# Patient Record
Sex: Male | Born: 1945 | Race: White | Hispanic: No | Marital: Married | Smoking: Never smoker
Health system: Southern US, Community
[De-identification: ages and names within clinical notes are randomized; demographics above are authoritative.]

## PROBLEM LIST (undated history)

## (undated) DIAGNOSIS — I10 Essential (primary) hypertension: Secondary | ICD-10-CM

## (undated) DIAGNOSIS — F419 Anxiety disorder, unspecified: Secondary | ICD-10-CM

## (undated) DIAGNOSIS — K219 Gastro-esophageal reflux disease without esophagitis: Secondary | ICD-10-CM

## (undated) DIAGNOSIS — E785 Hyperlipidemia, unspecified: Secondary | ICD-10-CM

## (undated) DIAGNOSIS — N400 Enlarged prostate without lower urinary tract symptoms: Secondary | ICD-10-CM

## (undated) DIAGNOSIS — I499 Cardiac arrhythmia, unspecified: Secondary | ICD-10-CM

## (undated) DIAGNOSIS — F329 Major depressive disorder, single episode, unspecified: Secondary | ICD-10-CM

## (undated) DIAGNOSIS — R0600 Dyspnea, unspecified: Secondary | ICD-10-CM

## (undated) DIAGNOSIS — K5792 Diverticulitis of intestine, part unspecified, without perforation or abscess without bleeding: Secondary | ICD-10-CM

## (undated) DIAGNOSIS — E119 Type 2 diabetes mellitus without complications: Secondary | ICD-10-CM

## (undated) DIAGNOSIS — F32A Depression, unspecified: Secondary | ICD-10-CM

## (undated) HISTORY — DX: Anxiety disorder, unspecified: F41.9

---

## 2004-10-02 ENCOUNTER — Ambulatory Visit: Payer: Self-pay | Admitting: General Surgery

## 2004-10-21 ENCOUNTER — Ambulatory Visit: Payer: Self-pay

## 2004-11-23 ENCOUNTER — Ambulatory Visit: Payer: Self-pay | Admitting: Pain Medicine

## 2004-12-07 ENCOUNTER — Ambulatory Visit: Payer: Self-pay | Admitting: Pain Medicine

## 2004-12-08 ENCOUNTER — Ambulatory Visit: Payer: Self-pay | Admitting: Pain Medicine

## 2004-12-24 ENCOUNTER — Ambulatory Visit: Payer: Self-pay | Admitting: Physician Assistant

## 2004-12-31 ENCOUNTER — Ambulatory Visit: Payer: Self-pay | Admitting: Pain Medicine

## 2005-01-15 ENCOUNTER — Ambulatory Visit: Payer: Self-pay | Admitting: Physician Assistant

## 2005-02-02 ENCOUNTER — Ambulatory Visit: Payer: Self-pay | Admitting: Pain Medicine

## 2005-03-01 ENCOUNTER — Ambulatory Visit: Payer: Self-pay | Admitting: Physician Assistant

## 2005-03-09 ENCOUNTER — Ambulatory Visit: Payer: Self-pay | Admitting: Pain Medicine

## 2005-03-23 ENCOUNTER — Ambulatory Visit: Payer: Self-pay | Admitting: Physician Assistant

## 2005-05-03 ENCOUNTER — Emergency Department: Payer: Self-pay | Admitting: General Practice

## 2005-05-07 ENCOUNTER — Ambulatory Visit: Payer: Self-pay | Admitting: Physician Assistant

## 2005-05-21 ENCOUNTER — Ambulatory Visit: Payer: Self-pay | Admitting: Physician Assistant

## 2005-05-25 ENCOUNTER — Ambulatory Visit: Payer: Self-pay | Admitting: Pain Medicine

## 2005-05-27 ENCOUNTER — Ambulatory Visit: Payer: Self-pay | Admitting: Pain Medicine

## 2005-06-16 ENCOUNTER — Ambulatory Visit: Payer: Self-pay | Admitting: Pain Medicine

## 2005-12-29 ENCOUNTER — Ambulatory Visit: Payer: Self-pay | Admitting: Physician Assistant

## 2006-04-08 ENCOUNTER — Ambulatory Visit: Payer: Self-pay | Admitting: Physician Assistant

## 2006-07-18 ENCOUNTER — Ambulatory Visit: Payer: Self-pay | Admitting: Physician Assistant

## 2006-08-08 ENCOUNTER — Ambulatory Visit: Payer: Self-pay | Admitting: Physician Assistant

## 2006-08-09 ENCOUNTER — Ambulatory Visit: Payer: Self-pay | Admitting: Pain Medicine

## 2006-09-06 ENCOUNTER — Ambulatory Visit: Payer: Self-pay | Admitting: Physician Assistant

## 2006-11-07 ENCOUNTER — Ambulatory Visit: Payer: Self-pay | Admitting: Physician Assistant

## 2007-01-17 ENCOUNTER — Ambulatory Visit: Payer: Self-pay | Admitting: Physician Assistant

## 2007-02-20 ENCOUNTER — Ambulatory Visit: Payer: Self-pay | Admitting: Physician Assistant

## 2007-04-24 ENCOUNTER — Ambulatory Visit: Payer: Self-pay | Admitting: Physician Assistant

## 2008-06-12 ENCOUNTER — Emergency Department: Payer: Self-pay | Admitting: Emergency Medicine

## 2008-09-17 ENCOUNTER — Ambulatory Visit: Payer: Self-pay | Admitting: General Surgery

## 2009-07-11 ENCOUNTER — Ambulatory Visit: Payer: Self-pay | Admitting: Internal Medicine

## 2010-07-22 ENCOUNTER — Ambulatory Visit: Payer: Self-pay | Admitting: Unknown Physician Specialty

## 2010-07-28 LAB — PATHOLOGY REPORT

## 2013-10-27 ENCOUNTER — Emergency Department: Payer: Self-pay | Admitting: Emergency Medicine

## 2013-10-27 LAB — CBC WITH DIFFERENTIAL/PLATELET
BASOS ABS: 0.1 10*3/uL (ref 0.0–0.1)
BASOS PCT: 0.8 %
Eosinophil #: 0.1 10*3/uL (ref 0.0–0.7)
Eosinophil %: 0.8 %
HCT: 44.5 % (ref 40.0–52.0)
HGB: 15.3 g/dL (ref 13.0–18.0)
Lymphocyte #: 1.8 10*3/uL (ref 1.0–3.6)
Lymphocyte %: 13.4 %
MCH: 28.8 pg (ref 26.0–34.0)
MCHC: 34.4 g/dL (ref 32.0–36.0)
MCV: 84 fL (ref 80–100)
MONO ABS: 0.9 x10 3/mm (ref 0.2–1.0)
Monocyte %: 6.8 %
Neutrophil #: 10.7 10*3/uL — ABNORMAL HIGH (ref 1.4–6.5)
Neutrophil %: 78.2 %
Platelet: 284 10*3/uL (ref 150–440)
RBC: 5.32 10*6/uL (ref 4.40–5.90)
RDW: 14.5 % (ref 11.5–14.5)
WBC: 13.7 10*3/uL — ABNORMAL HIGH (ref 3.8–10.6)

## 2013-10-27 LAB — BASIC METABOLIC PANEL
ANION GAP: 8 (ref 7–16)
BUN: 18 mg/dL (ref 7–18)
CALCIUM: 9.5 mg/dL (ref 8.5–10.1)
CHLORIDE: 100 mmol/L (ref 98–107)
CO2: 29 mmol/L (ref 21–32)
CREATININE: 1.04 mg/dL (ref 0.60–1.30)
EGFR (African American): >60
Glucose: 229 mg/dL — ABNORMAL HIGH (ref 65–99)
Osmolality: 283 (ref 275–301)
POTASSIUM: 3.5 mmol/L (ref 3.5–5.1)
Sodium: 137 mmol/L (ref 136–145)

## 2015-08-27 ENCOUNTER — Encounter: Payer: Self-pay | Admitting: *Deleted

## 2015-08-28 ENCOUNTER — Ambulatory Visit
Admission: RE | Admit: 2015-08-28 | Discharge: 2015-08-28 | Disposition: A | Discharge status: Home / Self Care | Payer: Medicare Other | Source: Ambulatory Visit | Attending: Unknown Physician Specialty | Admitting: Unknown Physician Specialty

## 2015-08-28 ENCOUNTER — Ambulatory Visit: Payer: Medicare Other | Admitting: Anesthesiology

## 2015-08-28 ENCOUNTER — Ambulatory Visit: Admit: 2015-08-28 | Payer: Self-pay | Admitting: Unknown Physician Specialty

## 2015-08-28 ENCOUNTER — Encounter
Admission: RE | Disposition: A | Discharge status: Home / Self Care | Payer: Self-pay | Source: Ambulatory Visit | Attending: Unknown Physician Specialty

## 2015-08-28 ENCOUNTER — Encounter: Payer: Self-pay | Admitting: Anesthesiology

## 2015-08-28 DIAGNOSIS — Z1211 Encounter for screening for malignant neoplasm of colon: Secondary | ICD-10-CM | POA: Insufficient documentation

## 2015-08-28 DIAGNOSIS — K219 Gastro-esophageal reflux disease without esophagitis: Secondary | ICD-10-CM | POA: Diagnosis not present

## 2015-08-28 DIAGNOSIS — E785 Hyperlipidemia, unspecified: Secondary | ICD-10-CM | POA: Insufficient documentation

## 2015-08-28 DIAGNOSIS — K640 First degree hemorrhoids: Secondary | ICD-10-CM | POA: Insufficient documentation

## 2015-08-28 DIAGNOSIS — D128 Benign neoplasm of rectum: Secondary | ICD-10-CM | POA: Diagnosis not present

## 2015-08-28 DIAGNOSIS — N400 Enlarged prostate without lower urinary tract symptoms: Secondary | ICD-10-CM | POA: Diagnosis not present

## 2015-08-28 DIAGNOSIS — E119 Type 2 diabetes mellitus without complications: Secondary | ICD-10-CM | POA: Diagnosis not present

## 2015-08-28 DIAGNOSIS — R1310 Dysphagia, unspecified: Secondary | ICD-10-CM | POA: Insufficient documentation

## 2015-08-28 DIAGNOSIS — K2970 Gastritis, unspecified, without bleeding: Secondary | ICD-10-CM | POA: Diagnosis not present

## 2015-08-28 DIAGNOSIS — F329 Major depressive disorder, single episode, unspecified: Secondary | ICD-10-CM | POA: Diagnosis not present

## 2015-08-28 HISTORY — DX: Type 2 diabetes mellitus without complications: E11.9

## 2015-08-28 HISTORY — DX: Hyperlipidemia, unspecified: E78.5

## 2015-08-28 HISTORY — DX: Depression, unspecified: F32.A

## 2015-08-28 HISTORY — PX: SAVORY DILATION: SHX5439

## 2015-08-28 HISTORY — DX: Gastro-esophageal reflux disease without esophagitis: K21.9

## 2015-08-28 HISTORY — PX: ESOPHAGOGASTRODUODENOSCOPY (EGD) WITH PROPOFOL: SHX5813

## 2015-08-28 HISTORY — DX: Major depressive disorder, single episode, unspecified: F32.9

## 2015-08-28 HISTORY — DX: Benign prostatic hyperplasia without lower urinary tract symptoms: N40.0

## 2015-08-28 HISTORY — PX: COLONOSCOPY WITH PROPOFOL: SHX5780

## 2015-08-28 LAB — GLUCOSE, CAPILLARY: Glucose-Capillary: 162 mg/dL — ABNORMAL HIGH (ref 65–99)

## 2015-08-28 SURGERY — ESOPHAGOGASTRODUODENOSCOPY (EGD) WITH PROPOFOL
Anesthesia: General

## 2015-08-28 MED ORDER — PHENYLEPHRINE HCL 10 MG/ML IJ SOLN
INTRAMUSCULAR | Status: DC | PRN
  Administered 2015-08-28: 50 ug via INTRAVENOUS

## 2015-08-28 MED ORDER — EPHEDRINE SULFATE 50 MG/ML IJ SOLN
INTRAMUSCULAR | Status: DC | PRN
  Administered 2015-08-28: 5 mg via INTRAVENOUS
  Administered 2015-08-28: 10 mg via INTRAVENOUS

## 2015-08-28 MED ORDER — MIDAZOLAM HCL 2 MG/2ML IJ SOLN
INTRAMUSCULAR | Status: DC | PRN
Start: 2015-08-28 — End: 2015-08-28
  Administered 2015-08-28: 1 mg via INTRAVENOUS

## 2015-08-28 MED ORDER — FENTANYL CITRATE (PF) 100 MCG/2ML IJ SOLN
INTRAMUSCULAR | Status: DC | PRN
  Administered 2015-08-28: 50 ug via INTRAVENOUS

## 2015-08-28 MED ORDER — SODIUM CHLORIDE 0.9 % IV SOLN
INTRAVENOUS | Status: DC
Start: 2015-08-28 — End: 2015-08-28
  Administered 2015-08-28: 1000 mL via INTRAVENOUS

## 2015-08-28 MED ORDER — SODIUM CHLORIDE 0.9 % IV SOLN: INTRAVENOUS | Status: DC

## 2015-08-28 MED ORDER — PROPOFOL 500 MG/50ML IV EMUL
INTRAVENOUS | Status: DC | PRN
  Administered 2015-08-28: 140 ug/kg/min via INTRAVENOUS

## 2015-08-28 NOTE — Op Note (Signed)
[facility] Gastroenterology Patient Name: Brent Alvarado Procedure Date: 08/28/2015 8:09 AM MRN: [id number] Account #: [id number] Date of Birth: Mar 31, 1946 Admit Type: Outpatient Age: 69 Room: [facility] ENDO ROOM 1 Gender: Male Note Status: Finalized Procedure:         Colonoscopy Indications:       Screening for colorectal malignant neoplasm Providers:         Manya Silvas, MD Medicines:         Propofol per Anesthesia Complications:     No immediate complications. Procedure:         Pre-Anesthesia Assessment:                    - After reviewing the risks and benefits, the patient was                     deemed in satisfactory condition to undergo the procedure.                    After obtaining informed consent, the colonoscope was                     passed under direct vision. Throughout the procedure, the                     patient's blood pressure, pulse, and oxygen saturations                     were monitored continuously. The Colonoscope was                     introduced through the anus and advanced to the the cecum,                     identified by appendiceal orifice and ileocecal valve. The                     colonoscopy was performed without difficulty. The patient                     tolerated the procedure well. The quality of the bowel                     preparation was excellent. Findings:      A diminutive polyp was found in the rectum. The polyp was sessile. The       polyp was removed with a jumbo cold forceps. Resection and retrieval       were complete.      Internal hemorrhoids were found during endoscopy. The hemorrhoids were       small and Grade I (internal hemorrhoids that do not prolapse).      The exam was otherwise without abnormality. Impression:        - One diminutive polyp in the rectum. Resected and                     retrieved.                    - Internal hemorrhoids.                    - The examination was  otherwise normal. Recommendation:    - Await pathology results. Manya Silvas, MD 08/28/2015 8:48:03 AM This report has been signed electronically. Number of Addenda:  0 Note Initiated On: 08/28/2015 8:09 AM Scope Withdrawal Time: 0 hours 10 minutes 48 seconds  Total Procedure Duration: 0 hours 15 minutes 59 seconds       [facility]

## 2015-08-28 NOTE — H&P (Signed)
   Primary Care Physician:  Rusty Aus., MD Primary Gastroenterologist:  Dr. Vira Agar  Pre-Procedure History & Physical: HPI:  Brent Alvarado is a 69 y.o. male is here for an endoscopy and colonoscopy.   Past Medical History  Diagnosis Date  . GERD (gastroesophageal reflux disease)   . Diabetes mellitus without complication ([place])   . Depression   . Hyperlipidemia   . BPH (benign prostatic hyperplasia)     History reviewed. No pertinent past surgical history.  Prior to Admission medications   Medication Sig Start Date End Date Taking? Authorizing Provider  baclofen (LIORESAL) 10 MG tablet Take 10 mg by mouth 3 (three) times daily.   Yes Historical Provider, MD  furosemide (LASIX) 20 MG tablet Take 20 mg by mouth.   Yes Historical Provider, MD  metFORMIN (GLUCOPHAGE) 500 MG tablet Take by mouth 2 (two) times daily with a meal.   Yes Historical Provider, MD  omeprazole (PRILOSEC) 40 MG capsule Take 40 mg by mouth daily.   Yes Historical Provider, MD  oxyCODONE-acetaminophen (PERCOCET/ROXICET) 5-325 MG tablet Take by mouth every 4 (four) hours as needed for severe pain.   Yes Historical Provider, MD  PARoxetine (PAXIL) 40 MG tablet Take 40 mg by mouth every morning.   Yes Historical Provider, MD  polyethylene glycol (MIRALAX / GLYCOLAX) packet Take 17 g by mouth daily.   Yes Historical Provider, MD  pravastatin (PRAVACHOL) 40 MG tablet Take 40 mg by mouth daily.   Yes Historical Provider, MD  tamsulosin (FLOMAX) 0.4 MG CAPS capsule Take 0.4 mg by mouth.   Yes Historical Provider, MD  traMADol (ULTRAM) 50 MG tablet Take by mouth every 6 (six) hours as needed.   Yes Historical Provider, MD    Allergies as of 06/11/2015  . (Not on File)    History reviewed. No pertinent family history.  Social History   Social History  . Marital Status: Married    Spouse Name: N/A  . Number of Children: N/A  . Years of Education: N/A   Occupational History  . Not on file.   Social History  Main Topics  . Smoking status: Not on file  . Smokeless tobacco: Not on file  . Alcohol Use: Not on file  . Drug Use: Not on file  . Sexual Activity: Not on file   Other Topics Concern  . Not on file   Social History Narrative    Review of Systems: See HPI, otherwise negative ROS  Physical Exam: BP 183/89 mmHg  Pulse 71  Temp(Src) 97.1 F (36.2 C) (Tympanic)  Resp 16  Ht 6\' 1"  (1.854 m)  Wt 95.255 kg (210 lb)  BMI 27.71 kg/m2  SpO2 99% General:   Alert,  pleasant and cooperative in NAD Head:  Normocephalic and atraumatic. Neck:  Supple; no masses or thyromegaly. Lungs:  Clear throughout to auscultation.    Heart:  Regular rate and rhythm. Abdomen:  Soft, nontender and nondistended. Normal bowel sounds, without guarding, and without rebound.   Neurologic:  Alert and  oriented x4;  grossly normal neurologically.  Impression/Plan: Brent Alvarado is here for an endoscopy and colonoscopy to be performed for dysphagia and colon cancer screening.  Risks, benefits, limitations, and alternatives regarding  endoscopy and colonoscopy have been reviewed with the patient.  Questions have been answered.  All parties agreeable.   Gaylyn Cheers, MD  08/28/2015, 8:10 AM

## 2015-08-28 NOTE — Anesthesia Procedure Notes (Signed)
Performed by: COOK-MARTIN, Lorilynn Lehr Pre-anesthesia Checklist: Patient identified, Emergency Drugs available, Suction available, Patient being monitored and Timeout performed Patient Re-evaluated:Patient Re-evaluated prior to inductionOxygen Delivery Method: Nasal cannula Preoxygenation: Pre-oxygenation with 100% oxygen Intubation Type: IV induction Placement Confirmation: positive ETCO2 and CO2 detector       

## 2015-08-28 NOTE — Transfer of Care (Signed)
Immediate Anesthesia Transfer of Care Note  Patient: Tim P Rozier  Procedure(s) Performed: Procedure(s): ESOPHAGOGASTRODUODENOSCOPY (EGD) WITH PROPOFOL (N/A) SAVORY DILATION (N/A) COLONOSCOPY WITH PROPOFOL (N/A)  Patient Location: PACU  Anesthesia Type:General  Level of Consciousness: awake and sedated  Airway & Oxygen Therapy: Patient Spontanous Breathing and Patient connected to nasal cannula oxygen  Post-op Assessment: Report given to RN and Post -op Vital signs reviewed and stable  Post vital signs: Reviewed and stable  Last Vitals:  Filed Vitals:   08/28/15 0718  BP: 183/89  Pulse: 71  Temp: 36.2 C  Resp: 16    Complications: No apparent anesthesia complications

## 2015-08-28 NOTE — Anesthesia Postprocedure Evaluation (Signed)
  Anesthesia Post-op Note  Patient: Brent Alvarado  Procedure(s) Performed: Procedure(s): ESOPHAGOGASTRODUODENOSCOPY (EGD) WITH PROPOFOL (N/A) SAVORY DILATION (N/A) COLONOSCOPY WITH PROPOFOL (N/A)  Anesthesia type:General  Patient location: PACU  Post pain: Pain level controlled  Post assessment: Post-op Vital signs reviewed, Patient's Cardiovascular Status Stable, Respiratory Function Stable, Patent Airway and No signs of Nausea or vomiting  Post vital signs: Reviewed and stable  Last Vitals:  Filed Vitals:   08/28/15 0922  BP: 152/77  Pulse: 69  Temp:   Resp: 14    Level of consciousness: awake, alert  and patient cooperative  Complications: No apparent anesthesia complications

## 2015-08-28 NOTE — Anesthesia Preprocedure Evaluation (Addendum)
Anesthesia Evaluation  Patient identified by MRN, date of birth, ID band Patient awake    Reviewed: Allergy & Precautions, NPO status   Airway Mallampati: III  TM Distance: >3 FB Neck ROM: Full    Dental  (+) Chipped   Pulmonary neg pulmonary ROS,    Pulmonary exam normal        Cardiovascular negative cardio ROS Normal cardiovascular exam     Neuro/Psych Depression    GI/Hepatic GERD  Medicated and Controlled,  Endo/Other  diabetes, Well Controlled, Type 2  Renal/GU   negative genitourinary   Musculoskeletal negative musculoskeletal ROS (+)   Abdominal Normal abdominal exam  (+)   Peds negative pediatric ROS (+)  Hematology negative hematology ROS (+)   Anesthesia Other Findings   Reproductive/Obstetrics                            Anesthesia Physical Anesthesia Plan  ASA: II  Anesthesia Plan: General   Post-op Pain Management:    Induction: Intravenous  Airway Management Planned: Nasal Cannula  Additional Equipment:   Intra-op Plan:   Post-operative Plan:   Informed Consent: I have reviewed the patients History and Physical, chart, labs and discussed the procedure including the risks, benefits and alternatives for the proposed anesthesia with the patient or authorized representative who has indicated his/her understanding and acceptance.   Dental advisory given  Plan Discussed with: CRNA and Surgeon  Anesthesia Plan Comments:         Anesthesia Quick Evaluation

## 2015-08-28 NOTE — Op Note (Signed)
[facility] Gastroenterology Patient Name: Brent Alvarado Procedure Date: 08/28/2015 8:08 AM MRN: [id number] Account #: [id number] Date of Birth: 1946-07-07 Admit Type: Outpatient Age: 69 Room: [facility] ENDO ROOM 1 Gender: Male Note Status: Finalized Procedure:         Upper GI endoscopy Indications:       Dysphagia Providers:         Manya Silvas, MD Referring MD:      Rusty Aus, MD (Referring MD) Medicines:         Propofol per Anesthesia Complications:     No immediate complications. Procedure:         Pre-Anesthesia Assessment:                    - After reviewing the risks and benefits, the patient was                     deemed in satisfactory condition to undergo the procedure.                    After obtaining informed consent, the endoscope was passed                     under direct vision. Throughout the procedure, the                     patient's blood pressure, pulse, and oxygen saturations                     were monitored continuously. The Endoscope was introduced                     through the mouth, and advanced to the second part of                     duodenum. The upper GI endoscopy was accomplished without                     difficulty. The patient tolerated the procedure well. Findings:      The examined esophagus was normal. At the end of the procedure A       guidewire was placed and the scope was withdrawn. Dilation was performed       with a Savary dilator with mild resistance at 17 mm.      Localized mild inflammation characterized by erythema and granularity       was found in the gastric antrum. Biopsies were taken with a cold forceps       for histology. Biopsies were taken with a cold forceps for Helicobacter       pylori testing.      The examined duodenum was normal. Impression:        - Normal esophagus. Dilated.                    - Gastritis. Biopsied.                    - Normal examined duodenum. Recommendation:     - Await pathology results. Do colonoscopy Manya Silvas, MD 08/28/2015 8:25:45 AM This report has been signed electronically. Number of Addenda: 0 Note Initiated On: 08/28/2015 8:08 AM      [facility]

## 2015-08-29 LAB — SURGICAL PATHOLOGY

## 2015-09-01 ENCOUNTER — Encounter: Payer: Self-pay | Admitting: Unknown Physician Specialty

## 2016-11-09 ENCOUNTER — Other Ambulatory Visit: Payer: Self-pay | Admitting: Nurse Practitioner

## 2016-11-09 DIAGNOSIS — R140 Abdominal distension (gaseous): Secondary | ICD-10-CM

## 2016-11-15 ENCOUNTER — Ambulatory Visit
Admission: RE | Admit: 2016-11-15 | Discharge: 2016-11-15 | Disposition: A | Discharge status: Home / Self Care | Payer: Medicare Other | Source: Ambulatory Visit | Attending: Nurse Practitioner | Admitting: Nurse Practitioner

## 2016-11-15 DIAGNOSIS — R140 Abdominal distension (gaseous): Secondary | ICD-10-CM | POA: Diagnosis present

## 2016-11-15 MED ORDER — TECHNETIUM TC 99M SULFUR COLLOID
2.0000 | Freq: Once | INTRAVENOUS | Status: AC | PRN
  Administered 2016-11-15: 2.718 via INTRAVENOUS

## 2017-05-02 ENCOUNTER — Encounter: Payer: Self-pay | Admitting: Emergency Medicine

## 2017-05-02 ENCOUNTER — Emergency Department: Payer: Medicare Other

## 2017-05-02 ENCOUNTER — Emergency Department
Admission: EM | Admit: 2017-05-02 | Discharge: 2017-05-02 | Disposition: A | Discharge status: Home / Self Care | Payer: Medicare Other | Attending: Emergency Medicine | Admitting: Emergency Medicine

## 2017-05-02 DIAGNOSIS — E119 Type 2 diabetes mellitus without complications: Secondary | ICD-10-CM | POA: Diagnosis not present

## 2017-05-02 DIAGNOSIS — Z7902 Long term (current) use of antithrombotics/antiplatelets: Secondary | ICD-10-CM | POA: Insufficient documentation

## 2017-05-02 DIAGNOSIS — R42 Dizziness and giddiness: Secondary | ICD-10-CM | POA: Diagnosis present

## 2017-05-02 DIAGNOSIS — Z79899 Other long term (current) drug therapy: Secondary | ICD-10-CM | POA: Insufficient documentation

## 2017-05-02 DIAGNOSIS — R531 Weakness: Secondary | ICD-10-CM | POA: Diagnosis not present

## 2017-05-02 DIAGNOSIS — Z7984 Long term (current) use of oral hypoglycemic drugs: Secondary | ICD-10-CM | POA: Insufficient documentation

## 2017-05-02 LAB — CBC
HCT: 38.5 % — ABNORMAL LOW (ref 40.0–52.0)
Hemoglobin: 13.4 g/dL (ref 13.0–18.0)
MCH: 30.1 pg (ref 26.0–34.0)
MCHC: 34.9 g/dL (ref 32.0–36.0)
MCV: 86.3 fL (ref 80.0–100.0)
PLATELETS: 284 10*3/uL (ref 150–440)
RBC: 4.46 MIL/uL (ref 4.40–5.90)
RDW: 14.4 % (ref 11.5–14.5)
WBC: 8.4 10*3/uL (ref 3.8–10.6)

## 2017-05-02 LAB — BASIC METABOLIC PANEL
Anion gap: 10 (ref 5–15)
BUN: 29 mg/dL — AB (ref 6–20)
CALCIUM: 9.2 mg/dL (ref 8.9–10.3)
CO2: 27 mmol/L (ref 22–32)
CREATININE: 1.24 mg/dL (ref 0.61–1.24)
Chloride: 102 mmol/L (ref 101–111)
GFR calc Af Amer: >60 mL/min (ref >60)
GFR, EST NON AFRICAN AMERICAN: 56 mL/min — AB (ref >60)
Glucose, Bld: 159 mg/dL — ABNORMAL HIGH (ref 65–99)
Potassium: 4.3 mmol/L (ref 3.5–5.1)
Sodium: 139 mmol/L (ref 135–145)

## 2017-05-02 LAB — TROPONIN I: Troponin I: <0.03 ng/mL (ref <0.03)

## 2017-05-02 MED ORDER — DIAZEPAM 5 MG PO TABS
5.0000 mg | ORAL_TABLET | Freq: Once | ORAL | Status: AC
  Administered 2017-05-02: 5 mg via ORAL
  Filled 2017-05-02: qty 1

## 2017-05-02 MED ORDER — SODIUM CHLORIDE 0.9 % IV BOLUS (SEPSIS)
1000.0000 mL | Freq: Once | INTRAVENOUS | Status: AC
  Administered 2017-05-02: 1000 mL via INTRAVENOUS

## 2017-05-02 NOTE — ED Notes (Signed)
FN: pt to ed via private vehicle with c/o being dizzy for two months with several falls per pt. Pt ambulatory to stat desk with walker, no distress noted. Pt PCP is Dr. Sabra Heck.

## 2017-05-02 NOTE — ED Notes (Signed)
Pt taken to MRI by [facility] ED Medic

## 2017-05-02 NOTE — ED Notes (Signed)
Pt returned from MRI °

## 2017-05-02 NOTE — ED Notes (Signed)
Pt states in January he got sick with vomiting. States that has cleared up but basically since then has been getting dizzy and falling often. States has seen Dr. Tami Ribas and been told it's not vertigo. States when he stands and moves around that he becomes dizzy. States he usually falls to left side. Mentions multiple falls. Wife states that she will grab his pants to keep him from falling to floor. Pt also states he usually catches himself before falling to floor. States 4-6 weeks ago fractured L ribs from fall. States he wants to see cardiologist. Not able to get in until end of august. States has neurology appt July 31st. States "I only eat because I have to."  Alert, oriented.

## 2017-05-02 NOTE — ED Provider Notes (Signed)
[facility] Emergency Department Provider Note  Time seen: 2:08 PM  I have reviewed the triage vital signs and the nursing notes.   HISTORY  Chief Complaint Dizziness and Fall    HPI Brent Alvarado is a 71 y.o. male with a past medical history of diabetes, gastric reflux, hyperlipidemia, presents to the emergency department for symptoms of weakness. Patient states over the past several months he has been experiencing intermittent episodes of weakness which she describes as a feeling of flushing across his body with left-sided weakness. He states he often will fall to his left side. Patient states the symptoms occur most often when walking or first upon standing. He states over the past several weeks in addition to feeling weak on the left side his left side goes numb and he is unable to feel or use his left arm or leg. Denies any headache. Denies any confusion or slurred speech. Patient has been seeing his doctor regarding these episodes who has referred him to a cardiologist as well as a neurologist. States he is not scheduled to see them until mid to late August. States his symptoms are worsening so he came to the emergency department for evaluation. Denies any acute worsening, but states they're progressively getting worse and he did not feel he could wait until August for an evaluation.  Past Medical History:  Diagnosis Date  . BPH (benign prostatic hyperplasia)   . Depression   . Diabetes mellitus without complication ([place])    Controlled poorly with metformin  . GERD (gastroesophageal reflux disease)   . Hyperlipidemia     There are no active problems to display for this patient.   Past Surgical History:  Procedure Laterality Date  . COLONOSCOPY WITH PROPOFOL N/A 08/28/2015   Procedure: COLONOSCOPY WITH PROPOFOL;  Surgeon: Manya Silvas, MD;  Location: [facility] ENDOSCOPY;  Service: Endoscopy;  Laterality: N/A;  . ESOPHAGOGASTRODUODENOSCOPY (EGD) WITH PROPOFOL  N/A 08/28/2015   Procedure: ESOPHAGOGASTRODUODENOSCOPY (EGD) WITH PROPOFOL;  Surgeon: Manya Silvas, MD;  Location: [facility] ENDOSCOPY;  Service: Endoscopy;  Laterality: N/A;  . SAVORY DILATION N/A 08/28/2015   Procedure: SAVORY DILATION;  Surgeon: Manya Silvas, MD;  Location: [facility] ENDOSCOPY;  Service: Endoscopy;  Laterality: N/A;    Prior to Admission medications   Medication Sig Start Date End Date Taking? Authorizing Provider  baclofen (LIORESAL) 10 MG tablet Take 10 mg by mouth 3 (three) times daily.   Yes [provider]  carvedilol (COREG) 12.5 MG tablet Take by mouth 2 (two) times daily with a meal.  01/27/17  Yes [provider]  docusate sodium (COLACE) 100 MG capsule Take 100 mg by mouth daily as needed.  11/30/16  Yes [provider]  furosemide (LASIX) 20 MG tablet Take 20 mg by mouth daily as needed.    Yes [provider]  losartan (COZAAR) 50 MG tablet Take by mouth daily.    Yes [provider]  metFORMIN (GLUCOPHAGE) 1000 MG tablet Take 1,000 mg by mouth 2 (two) times daily with a meal.    Yes [provider]  PARoxetine (PAXIL) 40 MG tablet Take 40 mg by mouth every morning.   Yes [provider]  pravastatin (PRAVACHOL) 40 MG tablet Take 40 mg by mouth daily.   Yes [provider]  tamsulosin (FLOMAX) 0.4 MG CAPS capsule Take 0.4 mg by mouth.   Yes [provider]  oxyCODONE-acetaminophen (PERCOCET/ROXICET) 5-325 MG tablet Take by mouth every 4 (four) hours as  needed for severe pain.    [provider]  SUMAtriptan (IMITREX) 100 MG tablet Take by mouth.    [provider]  traMADol (ULTRAM) 50 MG tablet Take by mouth every 6 (six) hours as needed.    [provider]    Allergies  Allergen Reactions  . Ace Inhibitors Swelling    No family history on file.  Social History Social History  Substance Use Topics  . Smoking status: Never Smoker  . Smokeless  tobacco: Never Used  . Alcohol use No    Review of Systems Constitutional: Negative for fever. Cardiovascular: Negative for chest pain. Respiratory: Negative for shortness of breath. Gastrointestinal: Negative for abdominal pain, vomiting and diarrhea. Genitourinary: Negative for dysuria. Musculoskeletal: Negative for back pain.Negative for neck pain. Skin: Negative for rash. Neurological: Negative for headache . Intermittent episodes of left arm and left leg weakness and numbness All other ROS negative  ____________________________________________   PHYSICAL EXAM:  VITAL SIGNS: ED Triage Vitals  Enc Vitals Group     BP 05/02/17 0957 121/72     Pulse Rate 05/02/17 0957 100     Resp 05/02/17 0957 16     Temp 05/02/17 0957 98.1 F (36.7 C)     Temp Source 05/02/17 0957 Oral     SpO2 05/02/17 0957 98 %     Weight 05/02/17 0943 210 lb (95.3 kg)     Height 05/02/17 0958 6' (1.829 m)     Head Circumference --      Peak Flow --      Pain Score --      Pain Loc --      Pain Edu? --      Excl. in [place]? --     Constitutional: Alert and oriented. Well appearing and in no distress. Eyes: Normal exam ENT   Head: Normocephalic and atraumatic.   Mouth/Throat: Mucous membranes are moist. Cardiovascular: Normal rate, regular rhythm. Respiratory: Normal respiratory effort without tachypnea nor retractions. Breath sounds are clear  Gastrointestinal: Soft and nontender. No distention.  Musculoskeletal: Nontender with normal range of motion in all extremities.  Neurologic:  Normal speech and language. No gross focal neurologic deficits Skin:  Skin is warm, dry and intact.  Psychiatric: Mood and affect are normal.  ____________________________________________    EKG  EKG reviewed and interpreted by myself shows normal sinus rhythm at 57 bpm, narrow QRS, normal axis, normal intervals, no concerning ST changes.  ____________________________________________     RADIOLOGY  Chest x-ray shows fracture of the left ninth rib which the patient states occurred last month after a fall.  ____________________________________________   INITIAL IMPRESSION / ASSESSMENT AND PLAN / ED COURSE  Pertinent labs & imaging results that were available during my care of the patient were reviewed by me and considered in my medical decision making (see chart for details).  Patient presents to the emergency department for intermittent episodes of left arm and leg weakness and numbness. He states usually when he is walking her first upon standing he will feel very lightheaded with a feeling of flushing going across his body followed by left arm weakness and numbness which typically lasts several minutes. Patient states he's had multiple falls to his left side due to the weakness. States it is always his left side because weak and numb. States the numbness started approximately 2 weeks ago. Patient denies any acute worsening of symptoms however states he did not feel he could wait until August for an evaluation so  he came to the emergency department. Here the patient looks well, no complaints at this time denies any weakness or numbness currently. Patient's neurological exam is normal. However given the patient's symptoms of left-sided symptoms we will obtain an MRI to further evaluate. Patient has appointment with neurology and cardiology but not until next month. Patient's labs are normal including negative troponin.  MRI is negative. Patient states he is feeling better he is requesting to be discharged home. We'll discharge the patient home with neurology and cardiology follow-up as scheduled.    ____________________________________________   FINAL CLINICAL IMPRESSION(S) / ED DIAGNOSES  Weakness    Harvest Dark, MD 05/02/17 [phone]

## 2017-05-02 NOTE — ED Triage Notes (Signed)
Pt with dizziness and falls for two mths.

## 2017-05-02 NOTE — ED Notes (Signed)
Pt in xray

## 2018-02-25 ENCOUNTER — Other Ambulatory Visit: Payer: Self-pay

## 2018-02-25 ENCOUNTER — Emergency Department
Admission: EM | Admit: 2018-02-25 | Discharge: 2018-02-25 | Disposition: A | Discharge status: Home / Self Care | Payer: Medicare Other | Attending: Emergency Medicine | Admitting: Emergency Medicine

## 2018-02-25 ENCOUNTER — Encounter: Payer: Self-pay | Admitting: Emergency Medicine

## 2018-02-25 ENCOUNTER — Emergency Department: Payer: Medicare Other

## 2018-02-25 DIAGNOSIS — Z7984 Long term (current) use of oral hypoglycemic drugs: Secondary | ICD-10-CM | POA: Diagnosis not present

## 2018-02-25 DIAGNOSIS — R112 Nausea with vomiting, unspecified: Secondary | ICD-10-CM

## 2018-02-25 DIAGNOSIS — Z79899 Other long term (current) drug therapy: Secondary | ICD-10-CM | POA: Diagnosis not present

## 2018-02-25 DIAGNOSIS — R1013 Epigastric pain: Secondary | ICD-10-CM | POA: Insufficient documentation

## 2018-02-25 DIAGNOSIS — I491 Atrial premature depolarization: Secondary | ICD-10-CM | POA: Diagnosis not present

## 2018-02-25 DIAGNOSIS — E119 Type 2 diabetes mellitus without complications: Secondary | ICD-10-CM | POA: Insufficient documentation

## 2018-02-25 DIAGNOSIS — R0602 Shortness of breath: Secondary | ICD-10-CM

## 2018-02-25 DIAGNOSIS — R42 Dizziness and giddiness: Secondary | ICD-10-CM

## 2018-02-25 LAB — COMPREHENSIVE METABOLIC PANEL
ALBUMIN: 4.6 g/dL (ref 3.5–5.0)
ALK PHOS: 66 U/L (ref 38–126)
ALT: 16 U/L — ABNORMAL LOW (ref 17–63)
AST: 22 U/L (ref 15–41)
Anion gap: 15 (ref 5–15)
BILIRUBIN TOTAL: 0.6 mg/dL (ref 0.3–1.2)
BUN: 18 mg/dL (ref 6–20)
CALCIUM: 10 mg/dL (ref 8.9–10.3)
CO2: 22 mmol/L (ref 22–32)
Chloride: 101 mmol/L (ref 101–111)
Creatinine, Ser: 1.41 mg/dL — ABNORMAL HIGH (ref 0.61–1.24)
GFR calc Af Amer: 56 mL/min — ABNORMAL LOW (ref >60)
GFR calc non Af Amer: 48 mL/min — ABNORMAL LOW (ref >60)
GLUCOSE: 265 mg/dL — AB (ref 65–99)
POTASSIUM: 3 mmol/L — AB (ref 3.5–5.1)
SODIUM: 138 mmol/L (ref 135–145)
TOTAL PROTEIN: 8.1 g/dL (ref 6.5–8.1)

## 2018-02-25 LAB — CBC
HEMATOCRIT: 43.1 % (ref 40.0–52.0)
HEMOGLOBIN: 15.2 g/dL (ref 13.0–18.0)
MCH: 31 pg (ref 26.0–34.0)
MCHC: 35.3 g/dL (ref 32.0–36.0)
MCV: 87.7 fL (ref 80.0–100.0)
Platelets: 406 10*3/uL (ref 150–440)
RBC: 4.92 MIL/uL (ref 4.40–5.90)
RDW: 14.3 % (ref 11.5–14.5)
WBC: 15.7 10*3/uL — ABNORMAL HIGH (ref 3.8–10.6)

## 2018-02-25 LAB — URINALYSIS, COMPLETE (UACMP) WITH MICROSCOPIC
BACTERIA UA: NONE SEEN
Bilirubin Urine: NEGATIVE
Glucose, UA: NEGATIVE mg/dL
HGB URINE DIPSTICK: NEGATIVE
Ketones, ur: 5 mg/dL — AB
Leukocytes, UA: NEGATIVE
NITRITE: NEGATIVE
PROTEIN: NEGATIVE mg/dL
SPECIFIC GRAVITY, URINE: 1.012 (ref 1.005–1.030)
Squamous Epithelial / LPF: NONE SEEN (ref 0–5)
pH: 6 (ref 5.0–8.0)

## 2018-02-25 LAB — TROPONIN I: TROPONIN I: 0.04 ng/mL — AB (ref <0.03)

## 2018-02-25 LAB — LIPASE, BLOOD: Lipase: 31 U/L (ref 11–51)

## 2018-02-25 MED ORDER — SODIUM CHLORIDE 0.9 % IV BOLUS
1000.0000 mL | Freq: Once | INTRAVENOUS | Status: AC
  Administered 2018-02-25: 1000 mL via INTRAVENOUS

## 2018-02-25 MED ORDER — METOPROLOL TARTRATE 5 MG/5ML IV SOLN
5.0000 mg | Freq: Once | INTRAVENOUS | Status: AC
  Administered 2018-02-25: 5 mg via INTRAVENOUS

## 2018-02-25 MED ORDER — POTASSIUM CHLORIDE CRYS ER 20 MEQ PO TBCR
40.0000 meq | EXTENDED_RELEASE_TABLET | Freq: Once | ORAL | Status: AC
  Administered 2018-02-25: 40 meq via ORAL
  Filled 2018-02-25: qty 2

## 2018-02-25 MED ORDER — MECLIZINE HCL 25 MG PO TABS
25.0000 mg | ORAL_TABLET | Freq: Once | ORAL | Status: AC
  Administered 2018-02-25: 25 mg via ORAL
  Filled 2018-02-25: qty 1

## 2018-02-25 MED ORDER — METOPROLOL TARTRATE 5 MG/5ML IV SOLN
INTRAVENOUS | Status: AC
  Filled 2018-02-25: qty 5

## 2018-02-25 MED ORDER — ONDANSETRON 4 MG PO TBDP
4.0000 mg | ORAL_TABLET | Freq: Once | ORAL | Status: AC | PRN
  Administered 2018-02-25: 4 mg via ORAL
  Filled 2018-02-25: qty 1

## 2018-02-25 NOTE — ED Notes (Signed)
Pt ambulated in hallway with assistance by this RN, once completed pt states he feels "dizzy, wobbly and short of breath." Pt's O2 98% on RA. EDP notified.

## 2018-02-25 NOTE — ED Notes (Signed)
Patient transported to X-ray 

## 2018-02-25 NOTE — Discharge Instructions (Addendum)
Please seek medical attention for any high fevers, chest pain, shortness of breath, change in behavior, persistent vomiting, bloody stool or any other new or concerning symptoms.  

## 2018-02-25 NOTE — ED Notes (Signed)
Pt given cherry popsicle

## 2018-02-25 NOTE — ED Triage Notes (Signed)
Vomiting, onset this am.  C/O headache. Denies abdominal pain.

## 2018-02-25 NOTE — ED Notes (Signed)

## 2018-02-25 NOTE — ED Notes (Signed)
ED Provider at bedside. 

## 2018-02-25 NOTE — ED Provider Notes (Signed)
[facility] Emergency Department Provider Note   ____________________________________________   I have reviewed the triage vital signs and the nursing notes.   HISTORY  Chief Complaint Shortness of breath  History limited by: Not Limited   HPI Brent Alvarado is a 72 y.o. male who presents to the emergency department with chief complaint of shortness of breath. He states that he has had this for years. Follows with Dr. Humphrey Rolls. It has been worse the past couple of weeks and he is scheduled for a stress test in two days. It appears however that the reason he came today was because of vomiting. Had multiple episodes of vomiting that started today. It was accompanied by some epigastric abdominal discomfort. He is also complaining of dizziness but this too has been present for quite some time.    Per medical record review patient has a history of DM  Past Medical History:  Diagnosis Date  . BPH (benign prostatic hyperplasia)   . Depression   . Diabetes mellitus without complication ([place])    Controlled poorly with metformin  . GERD (gastroesophageal reflux disease)   . Hyperlipidemia     There are no active problems to display for this patient.   Past Surgical History:  Procedure Laterality Date  . COLONOSCOPY WITH PROPOFOL N/A 08/28/2015   Procedure: COLONOSCOPY WITH PROPOFOL;  Surgeon: Manya Silvas, MD;  Location: [facility] ENDOSCOPY;  Service: Endoscopy;  Laterality: N/A;  . ESOPHAGOGASTRODUODENOSCOPY (EGD) WITH PROPOFOL N/A 08/28/2015   Procedure: ESOPHAGOGASTRODUODENOSCOPY (EGD) WITH PROPOFOL;  Surgeon: Manya Silvas, MD;  Location: [facility] ENDOSCOPY;  Service: Endoscopy;  Laterality: N/A;  . SAVORY DILATION N/A 08/28/2015   Procedure: SAVORY DILATION;  Surgeon: Manya Silvas, MD;  Location: [facility] ENDOSCOPY;  Service: Endoscopy;  Laterality: N/A;    Prior to Admission medications   Medication Sig Start Date End Date Taking? Authorizing Provider   baclofen (LIORESAL) 10 MG tablet Take 10 mg by mouth 3 (three) times daily.    [provider]  carvedilol (COREG) 12.5 MG tablet Take by mouth 2 (two) times daily with a meal.  01/27/17   [provider]  docusate sodium (COLACE) 100 MG capsule Take 100 mg by mouth daily as needed.  11/30/16   [provider]  furosemide (LASIX) 20 MG tablet Take 20 mg by mouth daily as needed.     [provider]  losartan (COZAAR) 50 MG tablet Take by mouth daily.     [provider]  metFORMIN (GLUCOPHAGE) 1000 MG tablet Take 1,000 mg by mouth 2 (two) times daily with a meal.     [provider]  oxyCODONE-acetaminophen (PERCOCET/ROXICET) 5-325 MG tablet Take by mouth every 4 (four) hours as needed for severe pain.    [provider]  PARoxetine (PAXIL) 40 MG tablet Take 40 mg by mouth every morning.    [provider]  pravastatin (PRAVACHOL) 40 MG tablet Take 40 mg by mouth daily.    [provider]  SUMAtriptan (IMITREX) 100 MG tablet Take by mouth.    [provider]  tamsulosin (FLOMAX) 0.4 MG CAPS capsule Take 0.4 mg by mouth.    [provider]  traMADol (ULTRAM) 50 MG tablet Take by mouth every 6 (six) hours as needed.    [provider]    Allergies Ace inhibitors  No family history on file.  Social History Social History   Tobacco Use  . Smoking status: Never Smoker  . Smokeless tobacco:  Never Used  Substance Use Topics  . Alcohol use: No  . Drug use: No    Review of Systems Constitutional: No fever/chills Eyes: No visual changes. ENT: No sore throat. Cardiovascular: Denies chest pain. Respiratory: Positive for shortness of breath. Gastrointestinal: Positive for nausea, vomiting. Positive for epigastric.  Genitourinary: Negative for dysuria. Musculoskeletal: Negative for back pain. Skin: Negative for rash. Neurological: Negative for headaches, focal weakness or  numbness.  ____________________________________________   PHYSICAL EXAM:  VITAL SIGNS: ED Triage Vitals  Enc Vitals Group     BP 02/25/18 1543 (!) 125/91     Pulse Rate 02/25/18 1543 67     Resp 02/25/18 1543 16     Temp 02/25/18 1543 97.6 F (36.4 C)     Temp Source 02/25/18 1543 Oral     SpO2 02/25/18 1543 98 %     Weight 02/25/18 1544 210 lb (95.3 kg)     Height 02/25/18 1544 6\' 1"  (1.854 m)     Head Circumference --      Peak Flow --      Pain Score 02/25/18 1544 0   Constitutional: Alert and oriented. Well appearing and in no distress. Eyes: Conjunctivae are normal.  ENT   Head: Normocephalic and atraumatic.   Nose: No congestion/rhinnorhea.   Mouth/Throat: Mucous membranes are moist.   Neck: No stridor. Hematological/Lymphatic/Immunilogical: No cervical lymphadenopathy. Cardiovascular: Normal rate, regular rhythm.  No murmurs, rubs, or gallops.  Respiratory: Normal respiratory effort without tachypnea nor retractions. Breath sounds are clear and equal bilaterally. No wheezes/rales/rhonchi. Gastrointestinal: Soft and non tender. No rebound. No guarding.  Genitourinary: Deferred Musculoskeletal: Normal range of motion in all extremities. No lower extremity edema. Neurologic:  Normal speech and language. No gross focal neurologic deficits are appreciated.  Skin:  Skin is warm, dry and intact. No rash noted. Psychiatric: Mood and affect are normal. Speech and behavior are normal. Patient exhibits appropriate insight and judgment.  ____________________________________________    LABS (pertinent positives/negatives)  Lipase 31 CBC wbc 15.7, hgb 15.2, plt 406 CMP na 138, k 3.0, glu 265, cr 1.41 ____________________________________________   EKG  I, Nance Pear, attending physician, personally viewed and interpreted this EKG  EKG Time: 1805 Rate: 120 Rhythm: sinus tachycardia with irregular rate Axis: normal Intervals: qtc 420 QRS: narrow, q  waves II, III, aVF ST changes: no st elevation Impression: abnormal ekg   ____________________________________________    RADIOLOGY  CXR No acute findings  ____________________________________________   PROCEDURES  Procedures  ____________________________________________   INITIAL IMPRESSION / ASSESSMENT AND PLAN / ED COURSE  Pertinent labs & imaging results that were available during my care of the patient were reviewed by me and considered in my medical decision making (see chart for details).  Patient presented to the emergency department today because of concerns for shortness of breath and also some dizziness.  Patient does state that the symptoms have been going on for a little while.  Is following with primary care pulmonologist.  On exam patient no acute distress.  Differential would be broad including anemia, arrhythmia, electrolyte abnormality, infection amongst other etiologies.  Work-up here showed a slight hypokalemia but no other concerning findings.  Patient was given potassium here.  Blood pressure did elevate and was given some medication to help bring it down.  Patient said he felt somewhat better.  Will discharge and have patient continue follow-up as outpatient.  ____________________________________________   FINAL CLINICAL IMPRESSION(S) / ED DIAGNOSES  Final diagnoses:  Nausea and vomiting, intractability  of vomiting not specified, unspecified vomiting type  Premature atrial contraction  Dizziness  Shortness of breath     Note: This dictation was prepared with Dragon dictation. Any transcriptional errors that result from this process are unintentional     Nance Pear, MD 02/26/18 1504

## 2018-03-28 ENCOUNTER — Other Ambulatory Visit: Payer: Self-pay | Admitting: Internal Medicine

## 2018-03-28 ENCOUNTER — Ambulatory Visit
Admission: RE | Admit: 2018-03-28 | Discharge: 2018-03-28 | Disposition: A | Discharge status: Home / Self Care | Payer: Medicare Other | Source: Ambulatory Visit | Attending: Internal Medicine | Admitting: Internal Medicine

## 2018-03-28 DIAGNOSIS — R1084 Generalized abdominal pain: Secondary | ICD-10-CM | POA: Insufficient documentation

## 2018-03-28 DIAGNOSIS — I6782 Cerebral ischemia: Secondary | ICD-10-CM | POA: Insufficient documentation

## 2018-03-28 DIAGNOSIS — R413 Other amnesia: Secondary | ICD-10-CM

## 2018-03-28 DIAGNOSIS — K4090 Unilateral inguinal hernia, without obstruction or gangrene, not specified as recurrent: Secondary | ICD-10-CM | POA: Insufficient documentation

## 2018-03-28 DIAGNOSIS — K760 Fatty (change of) liver, not elsewhere classified: Secondary | ICD-10-CM | POA: Insufficient documentation

## 2018-03-28 DIAGNOSIS — I708 Atherosclerosis of other arteries: Secondary | ICD-10-CM | POA: Diagnosis not present

## 2018-03-28 DIAGNOSIS — I700 Atherosclerosis of aorta: Secondary | ICD-10-CM | POA: Diagnosis not present

## 2018-03-28 DIAGNOSIS — M5136 Other intervertebral disc degeneration, lumbar region: Secondary | ICD-10-CM | POA: Diagnosis not present

## 2018-03-28 DIAGNOSIS — G319 Degenerative disease of nervous system, unspecified: Secondary | ICD-10-CM | POA: Diagnosis not present

## 2018-03-28 DIAGNOSIS — G9340 Encephalopathy, unspecified: Secondary | ICD-10-CM | POA: Diagnosis not present

## 2018-03-28 DIAGNOSIS — K5730 Diverticulosis of large intestine without perforation or abscess without bleeding: Secondary | ICD-10-CM | POA: Diagnosis not present

## 2018-03-28 DIAGNOSIS — R634 Abnormal weight loss: Secondary | ICD-10-CM | POA: Diagnosis present

## 2018-03-28 DIAGNOSIS — E279 Disorder of adrenal gland, unspecified: Secondary | ICD-10-CM | POA: Insufficient documentation

## 2018-03-28 HISTORY — DX: Essential (primary) hypertension: I10

## 2018-03-28 MED ORDER — IOHEXOL 300 MG/ML  SOLN
100.0000 mL | Freq: Once | INTRAMUSCULAR | Status: AC | PRN
  Administered 2018-03-28: 100 mL via INTRAVENOUS

## 2018-11-20 ENCOUNTER — Encounter: Payer: Medicare Other | Attending: Internal Medicine | Admitting: Dietician

## 2018-11-20 ENCOUNTER — Encounter: Payer: Self-pay | Admitting: Dietician

## 2018-11-20 VITALS — BP 156/88 | Ht 72.0 in | Wt 214.2 lb

## 2018-11-20 DIAGNOSIS — E119 Type 2 diabetes mellitus without complications: Secondary | ICD-10-CM | POA: Insufficient documentation

## 2018-11-20 NOTE — Progress Notes (Signed)
Diabetes Self-Management Education  Visit Type: First/Initial  Appt. Start Time: 1330 Appt. End Time: 1430  11/20/2018  Brent Alvarado, identified by name and date of birth, is a 73 y.o. male with a diagnosis of Diabetes: Type 2.   ASSESSMENT  Blood pressure (!) 156/88, height 6' (1.829 m), weight 214 lb 3.2 oz (97.2 kg). Body mass index is 29.05 kg/m.  Diabetes Self-Management Education - 11/20/18 1435      Visit Information   Visit Type  First/Initial      Initial Visit   Diabetes Type  Type 2      Health Coping   How would you rate your overall health?  Good      Psychosocial Assessment   Patient Belief/Attitude about Diabetes  Motivated to manage diabetes    Self-care barriers  None    Self-management support  Doctor's office;Family    Other persons present  Patient    Patient Concerns  Glycemic Control;Weight Control    Special Needs  None    Preferred Learning Style  Auditory    Learning Readiness  Ready    What is the last grade level you completed in school?  some college      Pre-Education Assessment   Patient understands the diabetes disease and treatment process.  Needs Review    Patient understands incorporating nutritional management into lifestyle.  Needs Review    Patient undertands incorporating physical activity into lifestyle.  Needs Review    Patient understands using medications safely.  Needs Review    Patient understands monitoring blood glucose, interpreting and using results  Needs Review    Patient understands prevention, detection, and treatment of acute complications.  Needs Review    Patient understands prevention, detection, and treatment of chronic complications.  Needs Review    Patient understands how to develop strategies to address psychosocial issues.  Needs Review    Patient understands how to develop strategies to promote health/change behavior.  Needs Review      Checks only  BG's at bedtime daily with recent results 140's Eye  exam was 3 years ago Last dental exam 09-2018 Does not check feet Eats 3 meals/day and 2 snacks Eats pretzels for afternoon and bedtime snacks Eats sweetened cold cereal with milk for breakfast daily Drinks water 4-5x/day, milk 3x/day, regular soda 2-3x/day, fruit juices 2x/day and diet drinks 2-3x/day   Individualized Plan for Diabetes Self-Management Training:   Learning Objective:  Patient will have a greater understanding of diabetes self-management. Patient education plan is to attend individual and/or group sessions per assessed needs and concerns.   Plan:   Patient Instructions   Check blood sugars 2 x day before breakfast and 2 hrs after supper every day and record   Bring blood sugar records to the next appointment/class  Exercise:  Try walking  20 min 3x/wk as permitted by MD  Follow meal planning guidelines  Eat 3 meals day and   1 snack/day at bedtime  Eat 3-4 carbohydrate servings/meal + protein  Eat 1 carbohydrate serving/snack + protein  Space meals 4-5 hours apart  Avoid sugar sweetened drinks (soda, tea, coffee, sports drinks, juices)  Limit intake of sweets, fried foods and snack foods  Drink plenty of water  Make an eye doctor appointment  Carry fast acting glucose and a snack at all times  Complete a 3 day food record and bring to next appointment  Return for appointment/classes on:  12-04-18   Expected Outcomes:   positive  Education material provided: General meal planning guidelines, Food Group handout, low BG handout, 1 tube glucose tablets for PRN use If problems or questions, patient to contact team via:  [phone]  Future DSME appointment:  12-04-18

## 2018-11-20 NOTE — Patient Instructions (Addendum)
  Check blood sugars 2 x day before breakfast and 2 hrs after supper every day and record   Bring blood sugar records to the next appointment/class  Exercise:  Try walking  20 min 3x/wk as permitted by MD  Follow meal planning guidelines  Eat 3 meals day and   1 snack/day at bedtime  Eat 3-4 carbohydrate servings/meal + protein  Eat 1 carbohydrate serving/snack + protein  Space meals 4-5 hours apart  Avoid sugar sweetened drinks (soda, tea, coffee, sports drinks, juices)  Limit intake of sweets, fried foods and snack foods  Drink plenty of water  Make an eye doctor appointment  Carry fast acting glucose and a snack at all times  Complete a 3 day food record and bring to next appointment  Return for appointment/classes on:  12-04-18

## 2018-12-04 ENCOUNTER — Ambulatory Visit: Payer: Medicare Other | Admitting: Dietician

## 2018-12-12 ENCOUNTER — Encounter: Payer: Self-pay | Admitting: Dietician

## 2019-07-19 ENCOUNTER — Other Ambulatory Visit: Payer: Self-pay | Admitting: Student

## 2019-07-19 ENCOUNTER — Other Ambulatory Visit (HOSPITAL_COMMUNITY): Payer: Self-pay | Admitting: Student

## 2019-07-19 DIAGNOSIS — K5900 Constipation, unspecified: Secondary | ICD-10-CM

## 2019-07-19 DIAGNOSIS — R1084 Generalized abdominal pain: Secondary | ICD-10-CM

## 2019-07-24 ENCOUNTER — Telehealth (HOSPITAL_COMMUNITY): Payer: Self-pay

## 2019-07-24 NOTE — Telephone Encounter (Signed)
Called patient 2x left msg

## 2019-08-06 ENCOUNTER — Other Ambulatory Visit: Payer: Self-pay

## 2019-08-06 ENCOUNTER — Ambulatory Visit
Admission: RE | Admit: 2019-08-06 | Discharge: 2019-08-06 | Disposition: A | Discharge status: Home / Self Care | Payer: Medicare Other | Source: Ambulatory Visit | Attending: Student | Admitting: Student

## 2019-08-06 DIAGNOSIS — K5900 Constipation, unspecified: Secondary | ICD-10-CM | POA: Diagnosis present

## 2019-08-06 DIAGNOSIS — R1084 Generalized abdominal pain: Secondary | ICD-10-CM | POA: Insufficient documentation

## 2019-08-06 LAB — POCT I-STAT CREATININE: Creatinine, Ser: 1.2 mg/dL (ref 0.61–1.24)

## 2019-08-06 MED ORDER — IOHEXOL 300 MG/ML  SOLN
100.0000 mL | Freq: Once | INTRAMUSCULAR | Status: AC | PRN
  Administered 2019-08-06: 100 mL via INTRAVENOUS

## 2020-02-07 ENCOUNTER — Ambulatory Visit: Payer: Self-pay | Admitting: Cardiovascular Disease

## 2020-02-07 ENCOUNTER — Other Ambulatory Visit: Payer: Self-pay | Admitting: Cardiovascular Disease

## 2020-02-07 DIAGNOSIS — I200 Unstable angina: Secondary | ICD-10-CM

## 2020-02-07 MED ORDER — SODIUM CHLORIDE 0.9 % IV SOLN
250.0000 mL | INTRAVENOUS | Status: AC | PRN
  Filled 2020-02-07: qty 250

## 2020-02-07 MED ORDER — ASPIRIN 81 MG PO CHEW
81.0000 mg | CHEWABLE_TABLET | ORAL | Status: AC
  Filled 2020-02-07: qty 1

## 2020-02-07 MED ORDER — SODIUM CHLORIDE 0.9 % WEIGHT BASED INFUSION
279.0000 mL/h | INTRAVENOUS | Status: DC
  Filled 2020-02-07: qty 1000

## 2020-02-07 MED ORDER — SODIUM CHLORIDE 0.9% FLUSH
3.0000 mL | Freq: Two times a day (BID) | INTRAVENOUS | Status: AC
  Filled 2020-02-07: qty 3

## 2020-02-07 MED ORDER — SODIUM CHLORIDE 0.9% FLUSH
3.0000 mL | INTRAVENOUS | Status: AC | PRN
  Filled 2020-02-07: qty 3

## 2020-02-07 MED ORDER — SODIUM CHLORIDE 0.9 % WEIGHT BASED INFUSION
93.0000 mL/h | INTRAVENOUS | Status: DC
  Filled 2020-02-07: qty 1000

## 2020-02-08 ENCOUNTER — Other Ambulatory Visit
Admission: RE | Admit: 2020-02-08 | Discharge: 2020-02-08 | Disposition: A | Discharge status: Home / Self Care | Payer: Medicare Other | Source: Ambulatory Visit | Attending: Cardiovascular Disease | Admitting: Cardiovascular Disease

## 2020-02-08 DIAGNOSIS — Z01812 Encounter for preprocedural laboratory examination: Secondary | ICD-10-CM | POA: Insufficient documentation

## 2020-02-08 DIAGNOSIS — Z20822 Contact with and (suspected) exposure to covid-19: Secondary | ICD-10-CM | POA: Diagnosis not present

## 2020-02-08 LAB — SARS CORONAVIRUS 2 (TAT 6-24 HRS): SARS Coronavirus 2: NEGATIVE

## 2020-02-11 ENCOUNTER — Encounter: Payer: Self-pay | Admitting: Cardiovascular Disease

## 2020-02-11 ENCOUNTER — Observation Stay
Admission: RE | Admit: 2020-02-11 | Discharge: 2020-02-12 | Disposition: A | Discharge status: Home / Self Care | Payer: Medicare Other | Attending: Internal Medicine | Admitting: Internal Medicine

## 2020-02-11 ENCOUNTER — Encounter
Admission: RE | Disposition: A | Discharge status: Home / Self Care | Payer: Self-pay | Source: Home / Self Care | Attending: Internal Medicine

## 2020-02-11 ENCOUNTER — Other Ambulatory Visit: Payer: Self-pay

## 2020-02-11 DIAGNOSIS — Z7902 Long term (current) use of antithrombotics/antiplatelets: Secondary | ICD-10-CM | POA: Insufficient documentation

## 2020-02-11 DIAGNOSIS — K219 Gastro-esophageal reflux disease without esophagitis: Secondary | ICD-10-CM | POA: Diagnosis not present

## 2020-02-11 DIAGNOSIS — I200 Unstable angina: Secondary | ICD-10-CM

## 2020-02-11 DIAGNOSIS — Z7982 Long term (current) use of aspirin: Secondary | ICD-10-CM | POA: Diagnosis not present

## 2020-02-11 DIAGNOSIS — Z794 Long term (current) use of insulin: Secondary | ICD-10-CM | POA: Insufficient documentation

## 2020-02-11 DIAGNOSIS — I480 Paroxysmal atrial fibrillation: Secondary | ICD-10-CM | POA: Diagnosis not present

## 2020-02-11 DIAGNOSIS — E785 Hyperlipidemia, unspecified: Secondary | ICD-10-CM | POA: Insufficient documentation

## 2020-02-11 DIAGNOSIS — F329 Major depressive disorder, single episode, unspecified: Secondary | ICD-10-CM | POA: Diagnosis not present

## 2020-02-11 DIAGNOSIS — Z955 Presence of coronary angioplasty implant and graft: Secondary | ICD-10-CM | POA: Diagnosis not present

## 2020-02-11 DIAGNOSIS — I10 Essential (primary) hypertension: Secondary | ICD-10-CM | POA: Diagnosis not present

## 2020-02-11 DIAGNOSIS — F419 Anxiety disorder, unspecified: Secondary | ICD-10-CM | POA: Insufficient documentation

## 2020-02-11 DIAGNOSIS — I249 Acute ischemic heart disease, unspecified: Secondary | ICD-10-CM | POA: Diagnosis not present

## 2020-02-11 DIAGNOSIS — I252 Old myocardial infarction: Secondary | ICD-10-CM | POA: Diagnosis not present

## 2020-02-11 DIAGNOSIS — Z79899 Other long term (current) drug therapy: Secondary | ICD-10-CM | POA: Diagnosis not present

## 2020-02-11 DIAGNOSIS — I25110 Atherosclerotic heart disease of native coronary artery with unstable angina pectoris: Secondary | ICD-10-CM | POA: Diagnosis not present

## 2020-02-11 DIAGNOSIS — E119 Type 2 diabetes mellitus without complications: Secondary | ICD-10-CM | POA: Diagnosis not present

## 2020-02-11 DIAGNOSIS — Z7901 Long term (current) use of anticoagulants: Secondary | ICD-10-CM | POA: Insufficient documentation

## 2020-02-11 DIAGNOSIS — N400 Enlarged prostate without lower urinary tract symptoms: Secondary | ICD-10-CM | POA: Diagnosis not present

## 2020-02-11 HISTORY — DX: Diverticulitis of intestine, part unspecified, without perforation or abscess without bleeding: K57.92

## 2020-02-11 HISTORY — PX: CORONARY STENT INTERVENTION: CATH118234

## 2020-02-11 HISTORY — DX: Dyspnea, unspecified: R06.00

## 2020-02-11 HISTORY — PX: LEFT HEART CATH AND CORONARY ANGIOGRAPHY: CATH118249

## 2020-02-11 HISTORY — DX: Cardiac arrhythmia, unspecified: I49.9

## 2020-02-11 LAB — HEMOGLOBIN A1C
Hgb A1c MFr Bld: 6.5 % — ABNORMAL HIGH (ref 4.8–5.6)
Mean Plasma Glucose: 139.85 mg/dL

## 2020-02-11 LAB — GLUCOSE, CAPILLARY
Glucose-Capillary: 117 mg/dL — ABNORMAL HIGH (ref 70–99)
Glucose-Capillary: 83 mg/dL (ref 70–99)
Glucose-Capillary: 125 mg/dL — ABNORMAL HIGH (ref 70–99)

## 2020-02-11 LAB — POCT ACTIVATED CLOTTING TIME: Activated Clotting Time: 632 seconds

## 2020-02-11 SURGERY — LEFT HEART CATH AND CORONARY ANGIOGRAPHY
Anesthesia: Moderate Sedation

## 2020-02-11 MED ORDER — MIDAZOLAM HCL 2 MG/2ML IJ SOLN
INTRAMUSCULAR | Status: DC | PRN
  Administered 2020-02-11: 1 mg via INTRAVENOUS

## 2020-02-11 MED ORDER — IOHEXOL 300 MG/ML  SOLN
INTRAMUSCULAR | Status: DC | PRN
  Administered 2020-02-11: 80 mL

## 2020-02-11 MED ORDER — BIVALIRUDIN TRIFLUOROACETATE 250 MG IV SOLR
INTRAVENOUS | Status: AC
  Filled 2020-02-11: qty 250

## 2020-02-11 MED ORDER — PAROXETINE HCL 20 MG PO TABS
40.0000 mg | ORAL_TABLET | ORAL | Status: DC
  Administered 2020-02-12: 40 mg via ORAL
  Filled 2020-02-11: qty 2

## 2020-02-11 MED ORDER — ONDANSETRON HCL 4 MG/2ML IJ SOLN: 4.0000 mg | Freq: Four times a day (QID) | INTRAMUSCULAR | Status: DC | PRN

## 2020-02-11 MED ORDER — SODIUM CHLORIDE 0.9 % IV SOLN: 250.0000 mL | INTRAVENOUS | Status: DC | PRN

## 2020-02-11 MED ORDER — CLOPIDOGREL BISULFATE 75 MG PO TABS
ORAL_TABLET | ORAL | Status: DC | PRN
  Administered 2020-02-11: 600 mg via ORAL

## 2020-02-11 MED ORDER — SODIUM CHLORIDE 0.9 % IV SOLN
INTRAVENOUS | Status: DC | PRN
  Administered 2020-02-11: 1.75 mg/kg/h via INTRAVENOUS

## 2020-02-11 MED ORDER — ASPIRIN 300 MG RE SUPP: 300.0000 mg | RECTAL | Status: DC

## 2020-02-11 MED ORDER — HYDRALAZINE HCL 20 MG/ML IJ SOLN
10.0000 mg | INTRAMUSCULAR | Status: DC | PRN
Start: 2020-02-11 — End: 2020-02-11

## 2020-02-11 MED ORDER — HEPARIN (PORCINE) IN NACL 1000-0.9 UT/500ML-% IV SOLN
INTRAVENOUS | Status: AC
  Filled 2020-02-11: qty 1000

## 2020-02-11 MED ORDER — ATORVASTATIN CALCIUM 80 MG PO TABS
80.0000 mg | ORAL_TABLET | Freq: Every day | ORAL | Status: DC
  Filled 2020-02-11: qty 1

## 2020-02-11 MED ORDER — ROSUVASTATIN CALCIUM 20 MG PO TABS
40.0000 mg | ORAL_TABLET | Freq: Every day | ORAL | Status: DC
  Administered 2020-02-12: 40 mg via ORAL
  Filled 2020-02-11: qty 4
  Filled 2020-02-11: qty 2

## 2020-02-11 MED ORDER — SODIUM CHLORIDE 0.9 % WEIGHT BASED INFUSION: 1.0000 mL/kg/h | INTRAVENOUS | Status: DC

## 2020-02-11 MED ORDER — FENTANYL CITRATE (PF) 100 MCG/2ML IJ SOLN
INTRAMUSCULAR | Status: AC
  Filled 2020-02-11: qty 2

## 2020-02-11 MED ORDER — IOHEXOL 300 MG/ML  SOLN
INTRAMUSCULAR | Status: DC | PRN
  Administered 2020-02-11: 225 mL via INTRA_ARTERIAL

## 2020-02-11 MED ORDER — SOTALOL HCL 80 MG PO TABS
80.0000 mg | ORAL_TABLET | Freq: Two times a day (BID) | ORAL | Status: DC
  Administered 2020-02-11 – 2020-02-12 (×2): 80 mg via ORAL
  Filled 2020-02-11 (×3): qty 1

## 2020-02-11 MED ORDER — STERILE WATER FOR INJECTION IV SOLN
INTRAVENOUS | Status: DC
  Filled 2020-02-11 (×2): qty 850

## 2020-02-11 MED ORDER — SODIUM CHLORIDE 0.9 % WEIGHT BASED INFUSION
1.0000 mL/kg/h | INTRAVENOUS | Status: AC
  Administered 2020-02-11: 1 mL/kg/h via INTRAVENOUS

## 2020-02-11 MED ORDER — LABETALOL HCL 5 MG/ML IV SOLN: 10.0000 mg | INTRAVENOUS | Status: DC | PRN

## 2020-02-11 MED ORDER — ASPIRIN 81 MG PO CHEW
81.0000 mg | CHEWABLE_TABLET | Freq: Every day | ORAL | Status: DC
  Administered 2020-02-12: 81 mg via ORAL
  Filled 2020-02-11: qty 1

## 2020-02-11 MED ORDER — SODIUM CHLORIDE 0.9% FLUSH: 3.0000 mL | Freq: Two times a day (BID) | INTRAVENOUS | Status: DC

## 2020-02-11 MED ORDER — ACETAMINOPHEN 325 MG PO TABS: 650.0000 mg | ORAL_TABLET | ORAL | Status: DC | PRN

## 2020-02-11 MED ORDER — POLYETHYLENE GLYCOL 3350 17 G PO PACK: 17.0000 g | PACK | Freq: Every day | ORAL | Status: DC

## 2020-02-11 MED ORDER — FENTANYL CITRATE (PF) 100 MCG/2ML IJ SOLN
INTRAMUSCULAR | Status: DC | PRN
  Administered 2020-02-11: 50 ug via INTRAVENOUS

## 2020-02-11 MED ORDER — ASPIRIN 81 MG PO CHEW
CHEWABLE_TABLET | ORAL | Status: AC
  Filled 2020-02-11: qty 1

## 2020-02-11 MED ORDER — SODIUM CHLORIDE 0.9% FLUSH
3.0000 mL | Freq: Two times a day (BID) | INTRAVENOUS | Status: DC
  Administered 2020-02-11: 3 mL via INTRAVENOUS

## 2020-02-11 MED ORDER — HEPARIN (PORCINE) IN NACL 1000-0.9 UT/500ML-% IV SOLN
INTRAVENOUS | Status: DC | PRN
  Administered 2020-02-11 (×2): 500 mL

## 2020-02-11 MED ORDER — INSULIN ASPART 100 UNIT/ML ~~LOC~~ SOLN: 0.0000 [IU] | Freq: Three times a day (TID) | SUBCUTANEOUS | Status: DC

## 2020-02-11 MED ORDER — STERILE WATER FOR INJECTION IV SOLN
INTRAVENOUS | Status: AC
  Filled 2020-02-11: qty 850

## 2020-02-11 MED ORDER — ACETAMINOPHEN 325 MG PO TABS
650.0000 mg | ORAL_TABLET | ORAL | Status: DC | PRN
  Administered 2020-02-12 (×3): 650 mg via ORAL
  Filled 2020-02-11 (×3): qty 2

## 2020-02-11 MED ORDER — NITROGLYCERIN 0.4 MG SL SUBL: 0.4000 mg | SUBLINGUAL_TABLET | SUBLINGUAL | Status: DC | PRN

## 2020-02-11 MED ORDER — SODIUM CHLORIDE 0.9% FLUSH: 3.0000 mL | INTRAVENOUS | Status: DC | PRN

## 2020-02-11 MED ORDER — ASPIRIN 81 MG PO CHEW: 81.0000 mg | CHEWABLE_TABLET | ORAL | Status: DC

## 2020-02-11 MED ORDER — NITROGLYCERIN 1 MG/10 ML FOR IR/CATH LAB
INTRA_ARTERIAL | Status: DC | PRN
  Administered 2020-02-11 (×2): 200 ug

## 2020-02-11 MED ORDER — CLOPIDOGREL BISULFATE 75 MG PO TABS
75.0000 mg | ORAL_TABLET | Freq: Every day | ORAL | Status: DC
  Administered 2020-02-12: 75 mg via ORAL
  Filled 2020-02-11: qty 1

## 2020-02-11 MED ORDER — INSULIN ASPART 100 UNIT/ML ~~LOC~~ SOLN: 0.0000 [IU] | Freq: Every day | SUBCUTANEOUS | Status: DC

## 2020-02-11 MED ORDER — ASPIRIN 81 MG PO CHEW: 324.0000 mg | CHEWABLE_TABLET | ORAL | Status: DC

## 2020-02-11 MED ORDER — FENTANYL CITRATE (PF) 100 MCG/2ML IJ SOLN
INTRAMUSCULAR | Status: DC | PRN
  Administered 2020-02-11: 25 ug via INTRAVENOUS

## 2020-02-11 MED ORDER — SODIUM CHLORIDE 0.9 % WEIGHT BASED INFUSION: 3.0000 mL/kg/h | INTRAVENOUS | Status: DC

## 2020-02-11 MED ORDER — HYDRALAZINE HCL 20 MG/ML IJ SOLN: 10.0000 mg | INTRAMUSCULAR | Status: AC | PRN

## 2020-02-11 MED ORDER — AMLODIPINE BESYLATE 5 MG PO TABS
10.0000 mg | ORAL_TABLET | Freq: Every day | ORAL | Status: DC
  Administered 2020-02-12: 10 mg via ORAL
  Filled 2020-02-11: qty 2

## 2020-02-11 MED ORDER — STERILE WATER FOR INJECTION IV SOLN
INTRAVENOUS | Status: DC
  Filled 2020-02-11 (×7): qty 850

## 2020-02-11 MED ORDER — TAMSULOSIN HCL 0.4 MG PO CAPS
0.4000 mg | ORAL_CAPSULE | Freq: Every day | ORAL | Status: DC
  Administered 2020-02-12: 09:00:00 0.4 mg via ORAL
  Filled 2020-02-11: qty 1

## 2020-02-11 MED ORDER — ASPIRIN EC 81 MG PO TBEC: 81.0000 mg | DELAYED_RELEASE_TABLET | Freq: Every day | ORAL | Status: DC

## 2020-02-11 MED ORDER — FENOFIBRATE 54 MG PO TABS
54.0000 mg | ORAL_TABLET | Freq: Every day | ORAL | Status: DC
  Administered 2020-02-12: 54 mg via ORAL
  Filled 2020-02-11: qty 1

## 2020-02-11 MED ORDER — LABETALOL HCL 5 MG/ML IV SOLN: 10.0000 mg | INTRAVENOUS | Status: AC | PRN

## 2020-02-11 MED ORDER — BIVALIRUDIN BOLUS VIA INFUSION - CUPID
INTRAVENOUS | Status: DC | PRN
  Administered 2020-02-11: 69.375 mg via INTRAVENOUS

## 2020-02-11 MED ORDER — CLOPIDOGREL BISULFATE 75 MG PO TABS
ORAL_TABLET | ORAL | Status: AC
  Filled 2020-02-11: qty 8

## 2020-02-11 MED ORDER — ACETAMINOPHEN 325 MG PO TABS
650.0000 mg | ORAL_TABLET | ORAL | Status: DC | PRN
Start: 2020-02-11 — End: 2020-02-11

## 2020-02-11 MED ORDER — MIDAZOLAM HCL 2 MG/2ML IJ SOLN
INTRAMUSCULAR | Status: AC
  Filled 2020-02-11: qty 2

## 2020-02-11 MED ORDER — SODIUM CHLORIDE 0.9 % IV SOLN
1.7500 mg/kg/h | INTRAVENOUS | Status: DC
  Filled 2020-02-11: qty 250

## 2020-02-11 MED ORDER — BACLOFEN 10 MG PO TABS
10.0000 mg | ORAL_TABLET | Freq: Three times a day (TID) | ORAL | Status: DC
  Administered 2020-02-11 – 2020-02-12 (×2): 10 mg via ORAL
  Filled 2020-02-11 (×5): qty 1

## 2020-02-11 SURGICAL SUPPLY — 18 items
BALLN TREK RX 2.25X20 (BALLOONS) ×4
BALLOON TREK RX 2.25X20 (BALLOONS) ×2 IMPLANT
CATH INFINITI 5FR ANG PIGTAIL (CATHETERS) ×4 IMPLANT
CATH INFINITI 5FR JL4 (CATHETERS) ×4 IMPLANT
CATH INFINITI 5FR JL5 (CATHETERS) ×4 IMPLANT
CATH INFINITI JR4 5F (CATHETERS) ×4 IMPLANT
CATH VISTA GUIDE 6FR XB3.5 (CATHETERS) ×4 IMPLANT
DEVICE CLOSURE MYNXGRIP 6/7F (Vascular Products) ×4 IMPLANT
DEVICE INFLAT 30 PLUS (MISCELLANEOUS) ×4 IMPLANT
KIT MANI 3VAL PERCEP (MISCELLANEOUS) ×4 IMPLANT
NEEDLE PERC 18GX7CM (NEEDLE) ×4 IMPLANT
PACK CARDIAC CATH (CUSTOM PROCEDURE TRAY) ×4 IMPLANT
SHEATH AVANTI 5FR X 11CM (SHEATH) ×4 IMPLANT
SHEATH AVANTI 6FR X 11CM (SHEATH) ×4 IMPLANT
STENT RESOLUTE ONYX 2.25X38 (Permanent Stent) ×4 IMPLANT
STENT RESOLUTE ONYX 2.5X15 (Permanent Stent) ×4 IMPLANT
WIRE G HI TQ BMW 190 (WIRE) ×4 IMPLANT
WIRE GUIDERIGHT .035X150 (WIRE) ×4 IMPLANT

## 2020-02-11 NOTE — Progress Notes (Addendum)
1915- Pt moving around in bed w/ head up and R leg bent. Bedrest orders until 1945. Education provided and reinforced. Pt needs frequent reminders. Pt does not understand importance of post cath care or seem to understand cath procedure. R groin c/d/i and soft. PAD device intact w/ 24mL air. Orders to remain in place overnight and begin removal in morning. Sodium Bicarb infusion complete. NS IVF started for 10 hours as ordered.   2000- Assisted pt to sit up for dinner. Bedrest complete. Up to bathroom w/ assistance. PAD device to remain in place until the morning. Frequent site checks performed.   0600- Disorientation/confusion easing overnight. Pt calls for bathroom/needs. Still forgetful, but easily reoriented.

## 2020-02-11 NOTE — H&P (Addendum)
[place] at Brent Alvarado NAME: Brent Alvarado    MR#:  [id number]  DATE OF BIRTH:  12-21-1945  DATE OF ADMISSION:  02/11/2020  PRIMARY CARE PHYSICIAN: Rusty Aus, MD   REQUESTING/REFERRING PHYSICIAN: Dr. Neoma Laming  Patient coming from : home   CHIEF COMPLAINT:   Shortness of breath for several weeks HISTORY OF PRESENT ILLNESS:  Brent Alvarado  is a 74 y.o. male with a known history of coronary artery disease, BPH, diabetes, hypertension, paroxysmal atrial fibrillation on Xarelto was scheduled for elective cardiac catheterization. Patient was found to have abnormal nuclear myoview stress test as outpatient. He started having increasing shortness of breath for several weeks was evaluated by cardiology as outpatient and scheduled to have elective cardiac catheterization. Status post drug-eluting stent placement to mid LAD by Dr. Clayborn Bigness.  Patient needs to be admitted for further management of his ACS/NSTEMI.  PEr Dr Humphrey Rolls took accidentally his Xarelto. He did have some oozing light. Pressure is being applied.  Patient seen in specials procedure. Denies any chest pain at present. Pt currently on IV angiomax post cath.  PAST MEDICAL HISTORY:   Past Medical History:  Diagnosis Date  . Anxiety   . BPH (benign prostatic hyperplasia)   . Depression   . Diabetes mellitus without complication ([place])    Controlled poorly with metformin  . Diverticulitis   . Dyspnea   . Dysrhythmia   . GERD (gastroesophageal reflux disease)   . Hyperlipidemia   . Hypertension     PAST SURGICAL HISTOIRY:   Past Surgical History:  Procedure Laterality Date  . COLONOSCOPY WITH PROPOFOL N/A 08/28/2015   Procedure: COLONOSCOPY WITH PROPOFOL;  Surgeon: Manya Silvas, MD;  Location: [facility] ENDOSCOPY;  Service: Endoscopy;  Laterality: N/A;  . ESOPHAGOGASTRODUODENOSCOPY (EGD) WITH PROPOFOL N/A 08/28/2015   Procedure: ESOPHAGOGASTRODUODENOSCOPY (EGD) WITH  PROPOFOL;  Surgeon: Manya Silvas, MD;  Location: [facility] ENDOSCOPY;  Service: Endoscopy;  Laterality: N/A;  . SAVORY DILATION N/A 08/28/2015   Procedure: SAVORY DILATION;  Surgeon: Manya Silvas, MD;  Location: [facility] ENDOSCOPY;  Service: Endoscopy;  Laterality: N/A;    SOCIAL HISTORY:   Social History   Tobacco Use  . Smoking status: Never Smoker  . Smokeless tobacco: Never Used  Substance Use Topics  . Alcohol use: No    FAMILY HISTORY:  History reviewed. No pertinent family history.  DRUG ALLERGIES:   Allergies  Allergen Reactions  . Ace Inhibitors Swelling    REVIEW OF SYSTEMS:  Review of Systems  Constitutional: Negative for chills, fever and weight loss.  HENT: Negative for ear discharge, ear pain and nosebleeds.   Eyes: Negative for blurred vision, pain and discharge.  Respiratory: Positive for shortness of breath. Negative for sputum production, wheezing and stridor.   Cardiovascular: Negative for chest pain, palpitations, orthopnea and PND.  Gastrointestinal: Negative for abdominal pain, diarrhea, nausea and vomiting.  Genitourinary: Negative for frequency and urgency.  Musculoskeletal: Negative for back pain and joint pain.  Neurological: Negative for sensory change, speech change, focal weakness and weakness.  Psychiatric/Behavioral: Negative for depression and hallucinations. The patient is not nervous/anxious.      MEDICATIONS AT HOME:   Prior to Admission medications   Medication Sig Start Date End Date Taking? Authorizing Provider  amLODipine (NORVASC) 10 MG tablet Take 10 mg by mouth daily.   Yes [provider]  Aspirin-Acetaminophen-Caffeine (EXCEDRIN PO) Take 2 tablets by mouth.   Yes [provider]  baclofen (  LIORESAL) 10 MG tablet Take 10 mg by mouth 3 (three) times daily.   Yes [provider]  chlorhexidine (PERIDEX) 0.12 % solution 5-10 mLs by Mouth Rinse route as needed. 11/13/18  Yes [provider]   Dulaglutide (TRULICITY) A999333 0000000 SOPN Inject into the skin once a week.   Yes [provider]  fenofibrate 54 MG tablet Take 54 mg by mouth daily.   Yes [provider]  flecainide (TAMBOCOR) 50 MG tablet Take 1 tablet by mouth 2 (two) times daily. 08/21/18  Yes [provider]  glimepiride (AMARYL) 4 MG tablet Take 1 tablet by mouth daily. 08/28/18 02/11/20 Yes [provider]  losartan (COZAAR) 50 MG tablet Take 50 mg by mouth every morning. 02/05/20  Yes [provider]  ondansetron (ZOFRAN) 4 MG tablet TAKE 1 TABLET BY MOUTH 3 TIMES DAILY AS NEEDED FOR NAUSEA FOR UP TO 7 DAYS 09/17/19  Yes [provider]  pantoprazole (PROTONIX) 40 MG tablet Take 1 tablet by mouth 2 (two) times daily. 03/28/18  Yes [provider]  PARoxetine (PAXIL) 40 MG tablet Take 40 mg by mouth every morning.   Yes [provider]  polyethylene glycol (MIRALAX / GLYCOLAX) 17 g packet Take 17 g by mouth daily.   Yes [provider]  rivaroxaban (XARELTO) 20 MG TABS tablet Take 1 tablet by mouth daily. 10/12/18  Yes [provider]  rosuvastatin (CRESTOR) 40 MG tablet Take 1 tablet by mouth daily. 09/04/18  Yes [provider]  sotalol (BETAPACE) 80 MG tablet Take 80 mg by mouth 2 (two) times daily.   Yes [provider]  tamsulosin (FLOMAX) 0.4 MG CAPS capsule Take 0.4 mg by mouth.   Yes [provider]  furosemide (LASIX) 20 MG tablet Take 20 mg by mouth daily.     [provider]      VITAL SIGNS:  Blood pressure (!) 156/91, pulse 70, temperature 97.8 F (36.6 C), temperature source Oral, resp. rate (!) 24, height 6' (1.829 m), weight 92.5 kg, SpO2 97 %.  PHYSICAL EXAMINATION:  GENERAL:  74 y.o.-year-old patient lying in the bed with no acute distress.  EYES: Pupils equal, round, reactive to light and accommodation. No scleral icterus.  HEENT: Head atraumatic, normocephalic. Oropharynx  and nasopharynx clear.  NECK:  Supple, no jugular venous distention. No thyroid enlargement, no tenderness.  LUNGS: Normal breath sounds bilaterally, no wheezing, rales,rhonchi or crepitation. No use of accessory muscles of respiration.  CARDIOVASCULAR: S1, S2 normal. No murmurs, rubs, or gallops.  ABDOMEN: Soft, nontender, nondistended. Bowel sounds present. No organomegaly or mass.  EXTREMITIES: No pedal edema, cyanosis, or clubbing.  NEUROLOGIC: Cranial nerves II through XII are intact. Muscle strength 5/5 in all extremities. Sensation intact. Gait not checked.  PSYCHIATRIC: The patient is alert and oriented x 3.  SKIN: No obvious rash, lesion, or ulcer.   LABORATORY PANEL:   CBC No results for input(s): WBC, HGB, HCT, PLT in the last 168 hours. ------------------------------------------------------------------------------------------------------------------  Chemistries  No results for input(s): NA, K, CL, CO2, GLUCOSE, BUN, CREATININE, CALCIUM, MG, AST, ALT, ALKPHOS, BILITOT in the last 168 hours.  Invalid input(s): GFRCGP ------------------------------------------------------------------------------------------------------------------  Cardiac Enzymes No results for input(s): TROPONINI in the last 168 hours. ------------------------------------------------------------------------------------------------------------------  RADIOLOGY:  CARDIAC CATHETERIZATION  Result Date: 02/11/2020  Prox RCA lesion is 40% stenosed.  1st Mrg lesion is 70% stenosed.  Mid LAD lesion is 85% stenosed.  High grade lesion of mid LAD and OM1, but PCI/DES  of LAD and for now treat medically OM lesion. Took Xarelto and apparently did follow instruction to stopp 2 days prior. But will angi0seal the femoral.    EKG:    IMPRESSION AND PLAN:   Brent Alvarado  is a 74 y.o. male with a known history of coronary artery disease, BPH, diabetes, hypertension, paroxysmal atrial fibrillation on Xarelto was  scheduled for elective cardiac catheterization. Patient was found to have abnormal nuclear myoview stress test as outpatient. He started having increasing shortness of breath for several weeks was evaluated by cardiology as outpatient and scheduled to have elective cardiac catheterization.  1.Unstable angina/ NSTEMI/abnormal stress test/with h/o CAD -admit to progressive cardiac unit -pt is status post drug-eluting stent x2 inmate LAD by Dr. Clayborn Bigness -will resume home meds including beta-blockers, statins -continue aspirin -other antiplatelet agents/meds for CAD per cardiology Dr Humphrey Rolls -holding losartan tonight, check metabolic panel tomorrow and resume if creatinine stable  2. History of paroxysmal atrial fibrillation -continue sotalol -resume Xarelto when okay with cardiology  3. Hypertension Resume losartan in am Check BMP  4. Type II diabetes -continue sliding scale -uses Trulicity at home  5. BPH -on Flomax   Family Communication : none in specials procedure Consults : cardiology Code Status : full code DVT prophylaxis : xarelto to be resumed per cardiology recomemndation  Status is: Inpatient  Remains inpatient appropriate because:Ongoing diagnostic testing needed not appropriate for outpatient work up   Dispo: The patient is from: Home              Anticipated d/c is to: Home              Anticipated d/c date is: 1 day              Patient currently is not medically stable to d/c.        TOTAL TIME TAKING CARE OF THIS PATIENT: 50 minutes.    Fritzi Mandes M.D  [facility]     CC: Primary care physician; Rusty Aus, MD

## 2020-02-11 NOTE — OR Nursing (Signed)
Pt post cath confused. Stating where am I how did get here? Wife at bedside she is not indicating that this is change in status. He keeps asking same questions over and over. Reorients then asks again. His wife said he is a bit worse than normal. But this week has been more than normal. She has found at home she tells him when she goes out and then later he said she didn't tell him. She said she writes down on paper to remind him where she is going.

## 2020-02-11 NOTE — Progress Notes (Signed)
Brief Pharmacy Note  Pharmacy consult for sodium bicarbonate infusion for cath for CIN prevention. Order for sodium bicarb in sterile water to run at 275 ml/hr x 1 hr (3 ml/kg/hr) followed by 90 ml/hr (1 ml/kg/hr). Consult states to hold diuretics day of procedure. No diuretics currently ordered or held.  Dorena Bodo, PharmD

## 2020-02-12 ENCOUNTER — Encounter: Payer: Self-pay | Admitting: Cardiology

## 2020-02-12 DIAGNOSIS — I249 Acute ischemic heart disease, unspecified: Secondary | ICD-10-CM | POA: Diagnosis not present

## 2020-02-12 DIAGNOSIS — I200 Unstable angina: Secondary | ICD-10-CM | POA: Diagnosis not present

## 2020-02-12 LAB — CBC
HCT: 36.7 % — ABNORMAL LOW (ref 39.0–52.0)
Hemoglobin: 12.6 g/dL — ABNORMAL LOW (ref 13.0–17.0)
MCH: 29.3 pg (ref 26.0–34.0)
MCHC: 34.3 g/dL (ref 30.0–36.0)
MCV: 85.3 fL (ref 80.0–100.0)
Platelets: 251 10*3/uL (ref 150–400)
RBC: 4.3 MIL/uL (ref 4.22–5.81)
RDW: 14.1 % (ref 11.5–15.5)
WBC: 10.4 10*3/uL (ref 4.0–10.5)
nRBC: 0 % (ref 0.0–0.2)

## 2020-02-12 LAB — GLUCOSE, CAPILLARY
Glucose-Capillary: 65 mg/dL — ABNORMAL LOW (ref 70–99)
Glucose-Capillary: 92 mg/dL (ref 70–99)
Glucose-Capillary: 153 mg/dL — ABNORMAL HIGH (ref 70–99)

## 2020-02-12 LAB — BASIC METABOLIC PANEL
Anion gap: 11 (ref 5–15)
BUN: 21 mg/dL (ref 8–23)
CO2: 25 mmol/L (ref 22–32)
Calcium: 9 mg/dL (ref 8.9–10.3)
Chloride: 107 mmol/L (ref 98–111)
Creatinine, Ser: 1.23 mg/dL (ref 0.61–1.24)
GFR calc Af Amer: >60 mL/min (ref >60)
GFR calc non Af Amer: 57 mL/min — ABNORMAL LOW (ref >60)
Glucose, Bld: 66 mg/dL — ABNORMAL LOW (ref 70–99)
Potassium: 3.7 mmol/L (ref 3.5–5.1)
Sodium: 143 mmol/L (ref 135–145)

## 2020-02-12 MED ORDER — ASPIRIN 81 MG PO CHEW: 81.0000 mg | CHEWABLE_TABLET | Freq: Every day | ORAL | 0 refills | Status: DC

## 2020-02-12 MED ORDER — CLOPIDOGREL BISULFATE 75 MG PO TABS: 75.0000 mg | ORAL_TABLET | Freq: Every day | ORAL | 0 refills | Status: DC

## 2020-02-12 MED ORDER — NITROGLYCERIN 0.4 MG SL SUBL: 0.4000 mg | SUBLINGUAL_TABLET | SUBLINGUAL | 1 refills | Status: AC | PRN

## 2020-02-12 MED ORDER — LOSARTAN POTASSIUM 50 MG PO TABS: 50.0000 mg | ORAL_TABLET | Freq: Every day | ORAL | Status: DC

## 2020-02-12 MED ORDER — POTASSIUM CHLORIDE CRYS ER 20 MEQ PO TBCR
20.0000 meq | EXTENDED_RELEASE_TABLET | Freq: Once | ORAL | Status: AC
  Administered 2020-02-12: 20 meq via ORAL
  Filled 2020-02-12: qty 1

## 2020-02-12 NOTE — Care Management Obs Status (Signed)
[facility] NOTIFICATION   Patient Details  Name: Brent Alvarado MRN: [id number] Date of Birth: 03-14-1946   Medicare Observation Status Notification Given:  Yes    Anselm Pancoast, RN 02/12/2020, 1:59 PM

## 2020-02-12 NOTE — Care Management CC44 (Signed)
Condition Code 44 Documentation Completed  Patient Details  Name: Brent Alvarado MRN: [id number] Date of Birth: [birth year]   Condition Code 44 given:  Yes Patient signature on Condition Code 44 notice:  Yes Documentation of 2 MD's agreement:  Yes Code 44 added to claim:  Yes    Anselm Pancoast, RN 02/12/2020, 1:59 PM

## 2020-02-12 NOTE — Discharge Summary (Addendum)
[facility] [place] NAME: Brent Alvarado    MR#:  [id number]  DATE OF BIRTH:  1945/12/16  DATE OF ADMISSION:  02/11/2020 ADMITTING PHYSICIAN: Fritzi Mandes, MD  DATE OF DISCHARGE: 02/12/2020  PRIMARY CARE PHYSICIAN: Rusty Aus, MD    ADMISSION DIAGNOSIS:  S/P drug eluting coronary stent placement [Z95.5] ACS (acute coronary syndrome) ([place]) [I24.9]  DISCHARGE DIAGNOSIS:  Unstable angina/ACS--s/p stent in Mid LAD  SECONDARY DIAGNOSIS:   Past Medical History:  Diagnosis Date  . Anxiety   . BPH (benign prostatic hyperplasia)   . Depression   . Diabetes mellitus without complication ([place])    Controlled poorly with metformin  . Diverticulitis   . Dyspnea   . Dysrhythmia   . GERD (gastroesophageal reflux disease)   . Hyperlipidemia   . Hypertension     HOSPITAL COURSE:   Brent Alvarado  is a 74 y.o. male with a known history of coronary artery disease, BPH, diabetes, hypertension, paroxysmal atrial fibrillation on Xarelto was scheduled for elective cardiac catheterization. Patient was found to have abnormal nuclear myoview stress test as outpatient. He started having increasing shortness of breath for several weeks was evaluated by cardiology as outpatient and scheduled to have elective cardiac catheterization.  1.Unstable angina/ NSTEMI/abnormal stress test/with h/o CAD -pt is status post drug-eluting stent x2 in mid LAD by Dr. Clayborn Bigness on 02/12/20 -resume home meds including beta-blockers, statins -continue aspirin, Plavix -resume losartan -patient denies any chest pain or shortness of breath.  2. History of paroxysmal atrial fibrillation -continue sotalol -continue to hold Xarelto per cardiology PA  3. Hypertension Resume losartan in am Check BMP  4. Type II diabetes -continue sliding scale -uses Trulicity, Glimipride at home  5. BPH -on Flomax   Family Communication :  -the room Consults : cardiology Code  Status : full code DVT prophylaxis : currently on aspirin and Plavix.  Status is: Inpatient  Dispo: The patient is from: Home  Anticipated d/c is to: Home  Anticipated d/c date is:  4/27  Patient currently is  medically stable to d/c. Okay from cardiology standpoint to go home.  CONSULTS OBTAINED:    DRUG ALLERGIES:   Allergies  Allergen Reactions  . Ace Inhibitors Swelling    DISCHARGE MEDICATIONS:   Allergies as of 02/12/2020      Reactions   Ace Inhibitors Swelling      Medication List    STOP taking these medications   chlorhexidine 0.12 % solution Commonly known as: PERIDEX   EXCEDRIN PO   flecainide 50 MG tablet Commonly known as: TAMBOCOR   Xarelto 20 MG Tabs tablet Generic drug: rivaroxaban     TAKE these medications   amLODipine 10 MG tablet Commonly known as: NORVASC Take 10 mg by mouth daily.   aspirin 81 MG chewable tablet Chew 1 tablet (81 mg total) by mouth daily. Start taking on: February 13, 2020   baclofen 10 MG tablet Commonly known as: LIORESAL Take 10 mg by mouth 3 (three) times daily.   clopidogrel 75 MG tablet Commonly known as: PLAVIX Take 1 tablet (75 mg total) by mouth daily with breakfast. Start taking on: February 13, 2020   fenofibrate 54 MG tablet Take 54 mg by mouth daily.   furosemide 20 MG tablet Commonly known as: LASIX Take 20 mg by mouth daily.   glimepiride 4 MG tablet Commonly known as: AMARYL Take 1 tablet by mouth daily.   losartan 50 MG tablet Commonly  known as: COZAAR Take 50 mg by mouth every morning.   nitroGLYCERIN 0.4 MG SL tablet Commonly known as: NITROSTAT Place 1 tablet (0.4 mg total) under the tongue every 5 (five) minutes x 3 doses as needed for chest pain.   ondansetron 4 MG tablet Commonly known as: ZOFRAN TAKE 1 TABLET BY MOUTH 3 TIMES DAILY AS NEEDED FOR NAUSEA FOR UP TO 7 DAYS   pantoprazole 40 MG tablet Commonly known as: PROTONIX Take 1 tablet  by mouth 2 (two) times daily.   PARoxetine 40 MG tablet Commonly known as: PAXIL Take 40 mg by mouth every morning.   polyethylene glycol 17 g packet Commonly known as: MIRALAX / GLYCOLAX Take 17 g by mouth daily.   rosuvastatin 40 MG tablet Commonly known as: CRESTOR Take 1 tablet by mouth daily.   sotalol 80 MG tablet Commonly known as: BETAPACE Take 80 mg by mouth 2 (two) times daily.   tamsulosin 0.4 MG Caps capsule Commonly known as: FLOMAX Take 0.4 mg by mouth.   Trulicity [id number] Sopn Generic drug: Dulaglutide Inject into the skin once a week.       If you experience worsening of your admission symptoms, develop shortness of breath, life threatening emergency, suicidal or homicidal thoughts you must seek medical attention immediately by calling 911 or calling your MD immediately  if symptoms less severe.  You Must read complete instructions/literature along with all the possible adverse reactions/side effects for all the Medicines you take and that have been prescribed to you. Take any new Medicines after you have completely understood and accept all the possible adverse reactions/side effects.   Please note  You were cared for by a hospitalist during your hospital stay. If you have any questions about your discharge medications or the care you received while you were in the hospital after you are discharged, you can call the unit and asked to speak with the hospitalist on call if the hospitalist that took care of you is not available. Once you are discharged, your primary care physician will handle any further medical issues. Please note that NO REFILLS for any discharge medications will be authorized once you are discharged, as it is imperative that you return to your primary care physician (or establish a relationship with a primary care physician if you do not have one) for your aftercare needs so that they can reassess your need for medications and monitor your  lab values. Today   SUBJECTIVE   Had one episode of diarrhea after drinking orange juice. Wife in the room. Denies any chest pain or shortness of breath  VITAL SIGNS:  Blood pressure 118/69, pulse 64, temperature 97.7 F (36.5 C), resp. rate 17, height 6' (1.829 m), weight 89.3 kg, SpO2 97 %.  I/O:    Intake/Output Summary (Last 24 hours) at 02/12/2020 1219 Last data filed at 02/12/2020 1212 Gross per 24 hour  Intake 1118.25 ml  Output 200 ml  Net 918.25 ml    PHYSICAL EXAMINATION:  GENERAL:  74 y.o.-year-old patient lying in the bed with no acute distress.  EYES: Pupils equal, round, reactive to light and accommodation. No scleral icterus.  HEENT: Head atraumatic, normocephalic. Oropharynx and nasopharynx clear.  NECK:  Supple, no jugular venous distention. No thyroid enlargement, no tenderness.  LUNGS: Normal breath sounds bilaterally, no wheezing, rales,rhonchi or crepitation. No use of accessory muscles of respiration.  CARDIOVASCULAR: S1, S2 normal. No murmurs, rubs, or gallops.  ABDOMEN: Soft, non-tender, non-distended. Bowel sounds present.  No organomegaly or mass.  EXTREMITIES: No pedal edema, cyanosis, or clubbing. No hematoma or bleeding noted at baseline cath site at present. NEUROLOGIC: Cranial nerves II through XII are intact. Muscle strength 5/5 in all extremities. Sensation intact. Gait not checked.  PSYCHIATRIC: The patient is alert and oriented x 3.  SKIN: No obvious rash, lesion, or ulcer.   DATA REVIEW:   CBC  Recent Labs  Lab 02/12/20 0604  WBC 10.4  HGB 12.6*  HCT 36.7*  PLT 251    Chemistries  Recent Labs  Lab 02/12/20 0604  NA 143  K 3.7  CL 107  CO2 25  GLUCOSE 66*  BUN 21  CREATININE 1.23  CALCIUM 9.0    Microbiology Results   Recent Results (from the past 240 hour(s))  SARS CORONAVIRUS 2 (TAT 6-24 HRS) Nasopharyngeal Nasopharyngeal Swab     Status: None   Collection Time: 02/08/20  8:41 AM   Specimen: Nasopharyngeal Swab   Result Value Ref Range Status   SARS Coronavirus 2 NEGATIVE NEGATIVE Final    Comment: (NOTE) SARS-CoV-2 target nucleic acids are NOT DETECTED. The SARS-CoV-2 RNA is generally detectable in upper and lower respiratory specimens during the acute phase of infection. Negative results do not preclude SARS-CoV-2 infection, do not rule out co-infections with other pathogens, and should not be used as the sole basis for treatment or other patient management decisions. Negative results must be combined with clinical observations, patient history, and epidemiological information. The expected result is Negative. Fact Sheet for Patients: SugarRoll.be Fact Sheet for Healthcare Providers: https://www.woods-mathews.com/ This test is not yet approved or cleared by the [place] FDA and  has been authorized for detection and/or diagnosis of SARS-CoV-2 by FDA under an Emergency Use Authorization (EUA). This EUA will remain  in effect (meaning this test can be used) for the duration of the COVID-19 declaration under Section 56 4(b)(1) of the Act, 21 U.S.C. section 360bbb-3(b)(1), unless the authorization is terminated or revoked sooner. Performed at [facility], [place] [address] [place], [place] [address]     RADIOLOGY:  CARDIAC CATHETERIZATION  Result Date: 02/11/2020  Mid LAD lesion is 80% stenosed.  A drug-eluting stent was successfully placed using a STENT RESOLUTE ONYX [id number].  Post intervention, there is a 0% residual stenosis.  A stent was successfully placed.    CARDIAC CATHETERIZATION  Result Date: 02/11/2020  Prox RCA lesion is 40% stenosed.  1st Mrg lesion is 70% stenosed.  Mid LAD lesion is 85% stenosed.  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 Took Xarelto and apparently did follow instruction to stopp 2 days prior. But will angi0seal the femoral.     CODE STATUS:      Code Status Orders  (From admission, onward)         Start     Ordered   02/11/20 1721  Full code  Continuous     02/11/20 1721        Code Status History    Date Active Date Inactive Code Status Order ID Comments User Context   02/11/2020 1720 02/11/2020 1721 Full Code [id number]  Yolonda Kida, MD Inpatient   02/11/2020 1720 02/11/2020 1720 Full Code [id number]  Dionisio David, MD Inpatient   Advance Care Planning Activity       TOTAL TIME TAKING CARE OF THIS PATIENT: *40* minutes.    Fritzi Mandes M.D  [facility]  CC: Primary care physician; Rusty Aus, MD

## 2020-02-12 NOTE — Progress Notes (Signed)
SUBJECTIVE: Patient doing well.  No active complaints.   Vitals:   02/11/20 1958 02/12/20 0004 02/12/20 0412 02/12/20 0735  BP: (!) 146/73 (!) 149/77 134/70 (!) 151/85  Pulse: 67 74 63 67  Resp:    17  Temp: 98.3 F (36.8 C) 98.4 F (36.9 C) 98.4 F (36.9 C) 97.8 F (36.6 C)  TempSrc: Oral Oral Oral   SpO2: 98% 97% 99% 99%  Weight:   89.3 kg   Height:        Intake/Output Summary (Last 24 hours) at 02/12/2020 1026 Last data filed at 02/12/2020 0945 Gross per 24 hour  Intake 1118.25 ml  Output 200 ml  Net 918.25 ml    LABS: Basic Metabolic Panel: Recent Labs    02/12/20 0604  NA 143  K 3.7  CL 107  CO2 25  GLUCOSE 66*  BUN 21  CREATININE 1.23  CALCIUM 9.0   Liver Function Tests: No results for input(s): AST, ALT, ALKPHOS, BILITOT, PROT, ALBUMIN in the last 72 hours. No results for input(s): LIPASE, AMYLASE in the last 72 hours. CBC: Recent Labs    02/12/20 0604  WBC 10.4  HGB 12.6*  HCT 36.7*  MCV 85.3  PLT 251   Cardiac Enzymes: No results for input(s): CKTOTAL, CKMB, CKMBINDEX, TROPONINI in the last 72 hours. BNP: Invalid input(s): POCBNP D-Dimer: No results for input(s): DDIMER in the last 72 hours. Hemoglobin A1C: Recent Labs    02/11/20 1818  HGBA1C 6.5*   Fasting Lipid Panel: No results for input(s): CHOL, HDL, LDLCALC, TRIG, CHOLHDL, LDLDIRECT in the last 72 hours. Thyroid Function Tests: No results for input(s): TSH, T4TOTAL, T3FREE, THYROIDAB in the last 72 hours.  Invalid input(s): FREET3 Anemia Panel: No results for input(s): VITAMINB12, FOLATE, FERRITIN, TIBC, IRON, RETICCTPCT in the last 72 hours.   PHYSICAL EXAM General: Well developed, well nourished, in no acute distress HEENT:  Normocephalic and atramatic Neck:  No JVD.  Lungs: Clear bilaterally to auscultation and percussion. Heart: HRRR . Normal S1 and S2 without gallops or murmurs.  Abdomen: Bowel sounds are positive, abdomen soft and non-tender  Msk:  Back normal,  normal gait. Normal strength and tone for age. Extremities: No clubbing, cyanosis or edema.   Neuro: Alert and oriented X 3. Psych:  Good affect, responds appropriately  ASSESSMENT AND PLAN: Cardiac cath was completed yesterday.  Patient was found to have an 80% occlusion in his mid LAD with placement of a drug-eluting stent.  Patient did not stop his Xarelto prior to procedure and therefore he was monitored overnight for bleeding.  On assessment patient's cath site is CDI without evidence of hematoma or bleeding.  Please continue to hold the patient's Xarelto but may resume Plavix and aspirin.  Patient is stable for discharge.  Patient will see be seen in the office on 4/30 at 9:45 AM.  Principal Problem:   Unstable angina ([place]) Active Problems:   S/P drug eluting coronary stent placement   ACS (acute coronary syndrome) [facility])   Essential hypertension    Adaline Sill, NP-C 02/12/2020 10:26 AM

## 2020-02-12 NOTE — Progress Notes (Signed)
Patient alert and oriented, vss, no complaints of pain.  Groin site intact.  Vascular discharge instructions given to patient.  Patient to be escorted out of hospital via wheelchair by volunteers.

## 2020-03-03 ENCOUNTER — Other Ambulatory Visit: Payer: Self-pay

## 2020-03-03 ENCOUNTER — Encounter: Payer: Medicare Other | Attending: Cardiovascular Disease | Admitting: *Deleted

## 2020-03-03 ENCOUNTER — Encounter: Payer: Self-pay | Admitting: *Deleted

## 2020-03-03 DIAGNOSIS — Z79899 Other long term (current) drug therapy: Secondary | ICD-10-CM | POA: Insufficient documentation

## 2020-03-03 DIAGNOSIS — Z955 Presence of coronary angioplasty implant and graft: Secondary | ICD-10-CM | POA: Insufficient documentation

## 2020-03-03 NOTE — Progress Notes (Signed)
Completed virtual orientation today.  EP evaluation is scheduled for Tuesday 5/18  at 2pm.  Documentation for diagnosis can be found in [facility] encounter 02/11/20.

## 2020-03-04 VITALS — Ht 71.5 in | Wt 197.6 lb

## 2020-03-04 DIAGNOSIS — Z79899 Other long term (current) drug therapy: Secondary | ICD-10-CM | POA: Diagnosis not present

## 2020-03-04 DIAGNOSIS — Z955 Presence of coronary angioplasty implant and graft: Secondary | ICD-10-CM

## 2020-03-04 NOTE — Patient Instructions (Signed)
Patient Instructions  Patient Details  Name: Brent Alvarado MRN: [id number] Date of Birth: 1946-07-23 Referring Provider:  Dionisio David, MD  Below are your personal goals for exercise, nutrition, and risk factors. Our goal is to help you stay on track towards obtaining and maintaining these goals. We will be discussing your progress on these goals with you throughout the program.  Initial Exercise Prescription: Initial Exercise Prescription - 03/04/20 1500      Date of Initial Exercise RX and Referring Provider   Date  03/04/20    Referring Provider  Humphrey Rolls, shaukat      Treadmill   MPH  1.5    Grade  0    Minutes  15    METs  2.15      Recumbant Bike   Level  1    RPM  60    Minutes  15    METs  2.13      NuStep   Level  2    SPM  80    Minutes  15    METs  2.13      REL-XR   Level  2    Speed  50    Minutes  15    METs  2.13      Prescription Details   Frequency (times per week)  3    Duration  Progress to 30 minutes of continuous aerobic without signs/symptoms of physical distress      Intensity   THRR 40-80% of Max Heartrate  97-129    Ratings of Perceived Exertion  11-15    Perceived Dyspnea  0-4      Resistance Training   Training Prescription  Yes    Weight  3 lb    Reps  10-15       Exercise Goals: Frequency: Be able to perform aerobic exercise two to three times per week in program working toward 2-5 days per week of home exercise.  Intensity: Work with a perceived exertion of 11 (fairly light) - 15 (hard) while following your exercise prescription.  We will make changes to your prescription with you as you progress through the program.   Duration: Be able to do 30 to 45 minutes of continuous aerobic exercise in addition to a 5 minute warm-up and a 5 minute cool-down routine.   Nutrition Goals: Your personal nutrition goals will be established when you do your nutrition analysis with the dietician.  The following are general nutrition  guidelines to follow: Cholesterol < 200mg /day Sodium < 1500mg /day Fiber: Men over 50 yrs - 30 grams per day  Personal Goals: Personal Goals and Risk Factors at Admission - 03/04/20 1522      Core Components/Risk Factors/Patient Goals on Admission    Weight Management  Yes    Intervention  Weight Management: Develop a combined nutrition and exercise program designed to reach desired caloric intake, while maintaining appropriate intake of nutrient and fiber, sodium and fats, and appropriate energy expenditure required for the weight goal.;Weight Management: Provide education and appropriate resources to help participant work on and attain dietary goals.    Admit Weight  197 lb 9.6 oz (89.6 kg)    Goal Weight: Short Term  195 lb (88.5 kg)    Goal Weight: Long Term  190 lb (86.2 kg)    Expected Outcomes  Short Term: Continue to assess and modify interventions until short term weight is achieved;Weight Maintenance: Understanding of the daily nutrition guidelines, which includes 25-35% calories  from fat, 7% or less cal from saturated fats, less than 200mg  cholesterol, less than 1.5gm of sodium, & 5 or more servings of fruits and vegetables daily    Diabetes  Yes    Intervention  Provide education about signs/symptoms and action to take for hypo/hyperglycemia.;Provide education about proper nutrition, including hydration, and aerobic/resistive exercise prescription along with prescribed medications to achieve blood glucose in normal ranges: Fasting glucose 65-99 mg/dL    Expected Outcomes  Short Term: Participant verbalizes understanding of the signs/symptoms and immediate care of hyper/hypoglycemia, proper foot care and importance of medication, aerobic/resistive exercise and nutrition plan for blood glucose control.;Long Term: Attainment of HbA1C < 7%.    Hypertension  Yes    Intervention  Provide education on lifestyle modifcations including regular physical activity/exercise, weight management,  moderate sodium restriction and increased consumption of fresh fruit, vegetables, and low fat dairy, alcohol moderation, and smoking cessation.;Monitor prescription use compliance.    Expected Outcomes  Short Term: Continued assessment and intervention until BP is < 140/90mm HG in hypertensive participants. < 130/52mm HG in hypertensive participants with diabetes, heart failure or chronic kidney disease.;Long Term: Maintenance of blood pressure at goal levels.    Lipids  Yes    Intervention  Provide education and support for participant on nutrition & aerobic/resistive exercise along with prescribed medications to achieve LDL 70mg , HDL >40mg .    Expected Outcomes  Short Term: Participant states understanding of desired cholesterol values and is compliant with medications prescribed. Participant is following exercise prescription and nutrition guidelines.;Long Term: Cholesterol controlled with medications as prescribed, with individualized exercise RX and with personalized nutrition plan. Value goals: LDL < 70mg , HDL > 40 mg.       Tobacco Use Initial Evaluation: Social History   Tobacco Use  Smoking Status Never Smoker  Smokeless Tobacco Never Used    Exercise Goals and Review: Exercise Goals    Row Name 03/04/20 1516             Exercise Goals   Increase Physical Activity  Yes       Intervention  Provide advice, education, support and counseling about physical activity/exercise needs.;Develop an individualized exercise prescription for aerobic and resistive training based on initial evaluation findings, risk stratification, comorbidities and participant's personal goals.       Expected Outcomes  Short Term: Attend rehab on a regular basis to increase amount of physical activity.;Long Term: Add in home exercise to make exercise part of routine and to increase amount of physical activity.;Long Term: Exercising regularly at least 3-5 days a week.       Increase Strength and Stamina  Yes        Intervention  Provide advice, education, support and counseling about physical activity/exercise needs.;Develop an individualized exercise prescription for aerobic and resistive training based on initial evaluation findings, risk stratification, comorbidities and participant's personal goals.       Expected Outcomes  Short Term: Increase workloads from initial exercise prescription for resistance, speed, and METs.;Short Term: Perform resistance training exercises routinely during rehab and add in resistance training at home;Long Term: Improve cardiorespiratory fitness, muscular endurance and strength as measured by increased METs and functional capacity (6MWT)       Able to understand and use rate of perceived exertion (RPE) scale  Yes       Intervention  Provide education and explanation on how to use RPE scale       Expected Outcomes  Short Term: Able to use RPE daily  in rehab to express subjective intensity level;Long Term:  Able to use RPE to guide intensity level when exercising independently       Able to understand and use Dyspnea scale  Yes       Intervention  Provide education and explanation on how to use Dyspnea scale       Expected Outcomes  Short Term: Able to use Dyspnea scale daily in rehab to express subjective sense of shortness of breath during exertion;Long Term: Able to use Dyspnea scale to guide intensity level when exercising independently       Knowledge and understanding of Target Heart Rate Range (THRR)  Yes       Intervention  Provide education and explanation of THRR including how the numbers were predicted and where they are located for reference       Expected Outcomes  Short Term: Able to state/look up THRR;Short Term: Able to use daily as guideline for intensity in rehab;Long Term: Able to use THRR to govern intensity when exercising independently       Able to check pulse independently  Yes       Intervention  Provide education and demonstration on how to check pulse  in carotid and radial arteries.;Review the importance of being able to check your own pulse for safety during independent exercise       Expected Outcomes  Short Term: Able to explain why pulse checking is important during independent exercise;Long Term: Able to check pulse independently and accurately       Understanding of Exercise Prescription  Yes       Intervention  Provide education, explanation, and written materials on patient's individual exercise prescription       Expected Outcomes  Short Term: Able to explain program exercise prescription;Long Term: Able to explain home exercise prescription to exercise independently          Copy of goals given to participant.

## 2020-03-04 NOTE — Progress Notes (Signed)
Cardiac Individual Treatment Plan  Patient Details  Name: Brent Alvarado MRN: [id number] Date of Birth: Nov 15, 1945 Referring Provider:     Cardiac Rehab from 03/04/2020 in [facility] Cardiac and Pulmonary Rehab  Referring Provider  Humphrey Rolls, [place]      Initial Encounter Date:    Cardiac Rehab from 03/04/2020 in [facility] Cardiac and Pulmonary Rehab  Date  03/04/20      Visit Diagnosis: Status post coronary artery stent placement  Patient's Home Medications on Admission:  Current Outpatient Medications:  .  amLODipine (NORVASC) 10 MG tablet, Take 10 mg by mouth daily., Disp: , Rfl:  .  aspirin 81 MG chewable tablet, Chew 1 tablet (81 mg total) by mouth daily. (Patient not taking: Reported on 03/03/2020), Disp: 30 tablet, Rfl: 0 .  baclofen (LIORESAL) 10 MG tablet, Take 10 mg by mouth 3 (three) times daily., Disp: , Rfl:  .  clopidogrel (PLAVIX) 75 MG tablet, Take 1 tablet (75 mg total) by mouth daily with breakfast., Disp: 30 tablet, Rfl: 0 .  Dulaglutide (TRULICITY) 9.93 TT/0.1XB SOPN, Inject into the skin once a week., Disp: , Rfl:  .  fenofibrate 54 MG tablet, Take 54 mg by mouth daily., Disp: , Rfl:  .  furosemide (LASIX) 20 MG tablet, Take 20 mg by mouth daily. , Disp: , Rfl:  .  glimepiride (AMARYL) 4 MG tablet, Take 1 tablet by mouth daily., Disp: , Rfl:  .  losartan (COZAAR) 50 MG tablet, Take 50 mg by mouth every morning., Disp: , Rfl:  .  nitroGLYCERIN (NITROSTAT) 0.4 MG SL tablet, Place 1 tablet (0.4 mg total) under the tongue every 5 (five) minutes x 3 doses as needed for chest pain., Disp: 20 tablet, Rfl: 1 .  ondansetron (ZOFRAN) 4 MG tablet, TAKE 1 TABLET BY MOUTH 3 TIMES DAILY AS NEEDED FOR NAUSEA FOR UP TO 7 DAYS, Disp: , Rfl:  .  pantoprazole (PROTONIX) 40 MG tablet, Take 1 tablet by mouth 2 (two) times daily., Disp: , Rfl:  .  PARoxetine (PAXIL) 40 MG tablet, Take 40 mg by mouth every morning., Disp: , Rfl:  .  polyethylene glycol (MIRALAX / GLYCOLAX) 17 g packet, Take 17 g by  mouth daily., Disp: , Rfl:  .  rosuvastatin (CRESTOR) 40 MG tablet, Take 1 tablet by mouth daily., Disp: , Rfl:  .  sotalol (BETAPACE) 80 MG tablet, Take 80 mg by mouth 2 (two) times daily., Disp: , Rfl:  .  tamsulosin (FLOMAX) 0.4 MG CAPS capsule, Take 0.4 mg by mouth., Disp: , Rfl:  .  XARELTO 15 MG TABS tablet, Take 15 mg by mouth daily., Disp: , Rfl:  No current facility-administered medications for this visit.  Facility-Administered Medications Ordered in Other Visits:  .  0.9 %  sodium chloride infusion, 250 mL, Intravenous, PRN, Neoma Laming A, MD .  sodium chloride flush (NS) 0.9 % injection 3 mL, 3 mL, Intravenous, Q12H, Neoma Laming A, MD .  sodium chloride flush (NS) 0.9 % injection 3 mL, 3 mL, Intravenous, PRN, Neoma Laming A, MD .  sodium chloride flush (NS) 0.9 % injection 3 mL, 3 mL, Intravenous, Q12H, Dionisio David, MD  Past Medical History: Past Medical History:  Diagnosis Date  . Anxiety   . BPH (benign prostatic hyperplasia)   . Depression   . Diabetes mellitus without complication ([place])    Controlled poorly with metformin  . Diverticulitis   . Dyspnea   . Dysrhythmia   . GERD (gastroesophageal reflux disease)   .  Hyperlipidemia   . Hypertension     Tobacco Use: Social History   Tobacco Use  Smoking Status Never Smoker  Smokeless Tobacco Never Used    Labs: Recent Review Flowsheet Data    Labs for ITP Cardiac and Pulmonary Rehab Latest Ref Rng & Units 02/11/2020   Hemoglobin A1c 4.8 - 5.6 % 6.5(H)       Exercise Target Goals: Exercise Program Goal: Individual exercise prescription set using results from initial 6 min walk test and THRR while considering  patient's activity barriers and safety.   Exercise Prescription Goal: Initial exercise prescription builds to 30-45 minutes a day of aerobic activity, 2-3 days per week.  Home exercise guidelines will be given to patient during program as part of exercise prescription that the participant will  acknowledge.   Education: Aerobic Exercise & Resistance Training: - Gives group verbal and written instruction on the various components of exercise. Focuses on aerobic and resistive training programs and the benefits of this training and how to safely progress through these programs..   Education: Exercise & Equipment Safety: - Individual verbal instruction and demonstration of equipment use and safety with use of the equipment.   Cardiac Rehab from 03/04/2020 in [facility] Cardiac and Pulmonary Rehab  Date  03/04/20  Educator  AS  Instruction Review Code  1- Verbalizes Understanding      Education: Exercise Physiology & General Exercise Guidelines: - Group verbal and written instruction with models to review the exercise physiology of the cardiovascular system and associated critical values. Provides general exercise guidelines with specific guidelines to those with heart or lung disease.    Education: Flexibility, Balance, Mind/Body Relaxation: Provides group verbal/written instruction on the benefits of flexibility and balance training, including mind/body exercise modes such as yoga, pilates and tai chi.  Demonstration and skill practice provided.   Activity Barriers & Risk Stratification: Activity Barriers & Cardiac Risk Stratification - 03/03/20 1405      Activity Barriers & Cardiac Risk Stratification   Activity Barriers  Back Problems;Neck/Spine Problems;Arthritis;Deconditioning;Muscular Weakness;Shortness of Breath   chronic low back pain   Cardiac Risk Stratification  Moderate       6 Minute Walk: 6 Minute Walk    Row Name 03/04/20 1512         6 Minute Walk   Phase  Initial     Distance  1098 feet     Walk Time  6 minutes     # of Rest Breaks  0     MPH  2.08     METS  2.13     RPE  13     Perceived Dyspnea   2     VO2 Peak  7.45     Symptoms  Yes (comment)     Comments  hip pain ( been for 30 years)     Resting HR  65 bpm     Resting BP  108/64     Resting  Oxygen Saturation   97 %     Exercise Oxygen Saturation  during 6 min walk  98 %     Max Ex. HR  90 bpm     Max Ex. BP  110/54     2 Minute Post BP  110/64        Oxygen Initial Assessment:   Oxygen Re-Evaluation:   Oxygen Discharge (Final Oxygen Re-Evaluation):   Initial Exercise Prescription: Initial Exercise Prescription - 03/04/20 1500      Date of Initial Exercise  RX and Referring Provider   Date  03/04/20    Referring Provider  Humphrey Rolls, shaukat      Treadmill   MPH  1.5    Grade  0    Minutes  15    METs  2.15      Recumbant Bike   Level  1    RPM  60    Minutes  15    METs  2.13      NuStep   Level  2    SPM  80    Minutes  15    METs  2.13      REL-XR   Level  2    Speed  50    Minutes  15    METs  2.13      Prescription Details   Frequency (times per week)  3    Duration  Progress to 30 minutes of continuous aerobic without signs/symptoms of physical distress      Intensity   THRR 40-80% of Max Heartrate  97-129    Ratings of Perceived Exertion  11-15    Perceived Dyspnea  0-4      Resistance Training   Training Prescription  Yes    Weight  3 lb    Reps  10-15       Perform Capillary Blood Glucose checks as needed.  Exercise Prescription Changes: Exercise Prescription Changes    Row Name 03/04/20 1500             Response to Exercise   Blood Pressure (Admit)  108/64       Blood Pressure (Exercise)  110/54       Blood Pressure (Exit)  110/64       Heart Rate (Admit)  65 bpm       Heart Rate (Exercise)  90 bpm       Heart Rate (Exit)  75 bpm       Oxygen Saturation (Admit)  97 %       Oxygen Saturation (Exercise)  98 %       Rating of Perceived Exertion (Exercise)  13       Perceived Dyspnea (Exercise)  2       Symptoms  hip pain          Exercise Comments:   Exercise Goals and Review: Exercise Goals    Row Name 03/04/20 1516             Exercise Goals   Increase Physical Activity  Yes       Intervention   Provide advice, education, support and counseling about physical activity/exercise needs.;Develop an individualized exercise prescription for aerobic and resistive training based on initial evaluation findings, risk stratification, comorbidities and participant's personal goals.       Expected Outcomes  Short Term: Attend rehab on a regular basis to increase amount of physical activity.;Long Term: Add in home exercise to make exercise part of routine and to increase amount of physical activity.;Long Term: Exercising regularly at least 3-5 days a week.       Increase Strength and Stamina  Yes       Intervention  Provide advice, education, support and counseling about physical activity/exercise needs.;Develop an individualized exercise prescription for aerobic and resistive training based on initial evaluation findings, risk stratification, comorbidities and participant's personal goals.       Expected Outcomes  Short Term: Increase workloads from initial exercise prescription for resistance, speed, and METs.;Short Term: Perform  resistance training exercises routinely during rehab and add in resistance training at home;Long Term: Improve cardiorespiratory fitness, muscular endurance and strength as measured by increased METs and functional capacity (6MWT)       Able to understand and use rate of perceived exertion (RPE) scale  Yes       Intervention  Provide education and explanation on how to use RPE scale       Expected Outcomes  Short Term: Able to use RPE daily in rehab to express subjective intensity level;Long Term:  Able to use RPE to guide intensity level when exercising independently       Able to understand and use Dyspnea scale  Yes       Intervention  Provide education and explanation on how to use Dyspnea scale       Expected Outcomes  Short Term: Able to use Dyspnea scale daily in rehab to express subjective sense of shortness of breath during exertion;Long Term: Able to use Dyspnea scale to  guide intensity level when exercising independently       Knowledge and understanding of Target Heart Rate Range (THRR)  Yes       Intervention  Provide education and explanation of THRR including how the numbers were predicted and where they are located for reference       Expected Outcomes  Short Term: Able to state/look up THRR;Short Term: Able to use daily as guideline for intensity in rehab;Long Term: Able to use THRR to govern intensity when exercising independently       Able to check pulse independently  Yes       Intervention  Provide education and demonstration on how to check pulse in carotid and radial arteries.;Review the importance of being able to check your own pulse for safety during independent exercise       Expected Outcomes  Short Term: Able to explain why pulse checking is important during independent exercise;Long Term: Able to check pulse independently and accurately       Understanding of Exercise Prescription  Yes       Intervention  Provide education, explanation, and written materials on patient's individual exercise prescription       Expected Outcomes  Short Term: Able to explain program exercise prescription;Long Term: Able to explain home exercise prescription to exercise independently          Exercise Goals Re-Evaluation :   Discharge Exercise Prescription (Final Exercise Prescription Changes): Exercise Prescription Changes - 03/04/20 1500      Response to Exercise   Blood Pressure (Admit)  108/64    Blood Pressure (Exercise)  110/54    Blood Pressure (Exit)  110/64    Heart Rate (Admit)  65 bpm    Heart Rate (Exercise)  90 bpm    Heart Rate (Exit)  75 bpm    Oxygen Saturation (Admit)  97 %    Oxygen Saturation (Exercise)  98 %    Rating of Perceived Exertion (Exercise)  13    Perceived Dyspnea (Exercise)  2    Symptoms  hip pain       Nutrition:  Target Goals: Understanding of nutrition guidelines, daily intake of sodium <1560m, cholesterol  <2013m calories 30% from fat and 7% or less from saturated fats, daily to have 5 or more servings of fruits and vegetables.  Education: Controlling Sodium/Reading Food Labels -Group verbal and written material supporting the discussion of sodium use in heart healthy nutrition. Review and explanation with models, verbal and  written materials for utilization of the food label.   Education: General Nutrition Guidelines/Fats and Fiber: -Group instruction provided by verbal, written material, models and posters to present the general guidelines for heart healthy nutrition. Gives an explanation and review of dietary fats and fiber.   Biometrics: Pre Biometrics - 03/04/20 1517      Pre Biometrics   Height  5' 11.5" (1.816 m)    Weight  197 lb 9.6 oz (89.6 kg)    BMI (Calculated)  27.18    Single Leg Stand  7.75 seconds        Nutrition Therapy Plan and Nutrition Goals:   Nutrition Assessments:   MEDIFICTS Score Key:          ?70 Need to make dietary changes          40-70 Heart Healthy Diet         ? 40 Therapeutic Level Cholesterol Diet  Nutrition Goals Re-Evaluation:   Nutrition Goals Discharge (Final Nutrition Goals Re-Evaluation):   Psychosocial: Target Goals: Acknowledge presence or absence of significant depression and/or stress, maximize coping skills, provide positive support system. Participant is able to verbalize types and ability to use techniques and skills needed for reducing stress and depression.   Education: Depression - Provides group verbal and written instruction on the correlation between heart/lung disease and depressed mood, treatment options, and the stigmas associated with seeking treatment.   Education: Sleep Hygiene -Provides group verbal and written instruction about how sleep can affect your health.  Define sleep hygiene, discuss sleep cycles and impact of sleep habits. Review good sleep hygiene tips.     Education: Stress and Anxiety: -  Provides group verbal and written instruction about the health risks of elevated stress and causes of high stress.  Discuss the correlation between heart/lung disease and anxiety and treatment options. Review healthy ways to manage with stress and anxiety.    Initial Review & Psychosocial Screening: Initial Psych Review & Screening - 03/03/20 1406      Initial Review   Current issues with  History of Depression;Current Psychotropic Meds;Current Anxiety/Panic;Current Depression;Current Stress Concerns    Source of Stress Concerns  Chronic Illness    Comments  more depression then anxiety and currently well managed on meds with Dr. Sabra Heck, overall doing well      Reliance?  Yes   wife, daughter (lives within a few miles)     Barriers   Psychosocial barriers to participate in program  The patient should benefit from training in stress management and relaxation.;Psychosocial barriers identified (see note)      Screening Interventions   Interventions  Encouraged to exercise;Provide feedback about the scores to participant;To provide support and resources with identified psychosocial needs    Expected Outcomes  Short Term goal: Utilizing psychosocial counselor, staff and physician to assist with identification of specific Stressors or current issues interfering with healing process. Setting desired goal for each stressor or current issue identified.;Long Term Goal: Stressors or current issues are controlled or eliminated.;Short Term goal: Identification and review with participant of any Quality of Life or Depression concerns found by scoring the questionnaire.;Long Term goal: The participant improves quality of Life and PHQ9 Scores as seen by post scores and/or verbalization of changes       Quality of Life Scores:  Quality of Life - 03/04/20 1518      Quality of Life   Select  Quality of Life  Quality of Life Scores   Health/Function Pre  20.2 %     Socioeconomic Pre  21.07 %    Psych/Spiritual Pre  24.43 %    Family Pre  22.8 %    GLOBAL Pre  21.63 %      Scores of 19 and below usually indicate a poorer quality of life in these areas.  A difference of  2-3 points is a clinically meaningful difference.  A difference of 2-3 points in the total score of the Quality of Life Index has been associated with significant improvement in overall quality of life, self-image, physical symptoms, and general health in studies assessing change in quality of life.  PHQ-9: Recent Review Flowsheet Data    Depression screen [facility] 2/9 03/04/2020 11/20/2018   Decreased Interest 0 0   Down, Depressed, Hopeless 0 0   PHQ - 2 Score 0 0   Altered sleeping 0 -   Tired, decreased energy 2 -   Change in appetite 0 -   Feeling bad or failure about yourself  0 -   Trouble concentrating 0 -   Moving slowly or fidgety/restless 0 -   Suicidal thoughts 0 -   PHQ-9 Score 2 -     Interpretation of Total Score  Total Score Depression Severity:  1-4 = Minimal depression, 5-9 = Mild depression, 10-14 = Moderate depression, 15-19 = Moderately severe depression, 20-27 = Severe depression   Psychosocial Evaluation and Intervention: Psychosocial Evaluation - 03/03/20 1413      Psychosocial Evaluation & Interventions   Interventions  Stress management education;Encouraged to exercise with the program and follow exercise prescription    Comments  Octavia Bruckner is 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  He has a great support system in his wife and daughter (who lives near by).  He really wants to try to avoid any of this happening agin. He has never really exercised consistently before and is curious as to how it will go for him.  He wants to make sure he does not have any drawbacks.  He does have a history of depression with a little bit of anxiety but is currently well managed on plaxil.  Exercise should also help give him a mental boost.    Expected Outcomes   Short: Attend rehab for mood boost with exercise. Long: Get into exercise routine.    Continue Psychosocial Services   Follow up required by staff       Psychosocial Re-Evaluation:   Psychosocial Discharge (Final Psychosocial Re-Evaluation):   Vocational Rehabilitation: Provide vocational rehab assistance to qualifying candidates.   Vocational Rehab Evaluation & Intervention: Vocational Rehab - 03/03/20 1406      Initial Vocational Rehab Evaluation & Intervention   Assessment shows need for Vocational Rehabilitation  No   retired      Education: Education Goals: Education classes will be provided on a variety of topics geared toward better understanding of heart health and risk factor modification. Participant will state understanding/return demonstration of topics presented as noted by education test scores.  Learning Barriers/Preferences: Learning Barriers/Preferences - 03/03/20 1405      Learning Barriers/Preferences   Learning Barriers  Sight   glasses   Learning Preferences  Skilled Demonstration       General Cardiac Education Topics:  AED/CPR: - Group verbal and written instruction with the use of models to demonstrate the basic use of the AED with the basic ABC's of resuscitation.   Anatomy & Physiology  of the Heart: - Group verbal and written instruction and models provide basic cardiac anatomy and physiology, with the coronary electrical and arterial systems. Review of Valvular disease and Heart Failure   Cardiac Procedures: - Group verbal and written instruction to review commonly prescribed medications for heart disease. Reviews the medication, class of the drug, and side effects. Includes the steps to properly store meds and maintain the prescription regimen. (beta blockers and nitrates)   Cardiac Medications I: - Group verbal and written instruction to review commonly prescribed medications for heart disease. Reviews the medication, class of the drug,  and side effects. Includes the steps to properly store meds and maintain the prescription regimen.   Cardiac Medications II: -Group verbal and written instruction to review commonly prescribed medications for heart disease. Reviews the medication, class of the drug, and side effects. (all other drug classes)    Go Sex-Intimacy & Heart Disease, Get SMART - Goal Setting: - Group verbal and written instruction through game format to discuss heart disease and the return to sexual intimacy. Provides group verbal and written material to discuss and apply goal setting through the application of the S.M.A.R.T. Method.   Other Matters of the Heart: - Provides group verbal, written materials and models to describe Stable Angina and Peripheral Artery. Includes description of the disease process and treatment options available to the cardiac patient.   Infection Prevention: - Provides verbal and written material to individual with discussion of infection control including proper hand washing and proper equipment cleaning during exercise session.   Cardiac Rehab from 03/04/2020 in [facility] Cardiac and Pulmonary Rehab  Date  03/04/20  Educator  AS  Instruction Review Code  1- Verbalizes Understanding      Falls Prevention: - Provides verbal and written material to individual with discussion of falls prevention and safety.   Cardiac Rehab from 03/04/2020 in [facility] Cardiac and Pulmonary Rehab  Date  03/04/20  Educator  AS  Instruction Review Code  1- Verbalizes Understanding      Other: -Provides group and verbal instruction on various topics (see comments)   Knowledge Questionnaire Score: Knowledge Questionnaire Score - 03/04/20 1518      Knowledge Questionnaire Score   Pre Score  22/26       Core Components/Risk Factors/Patient Goals at Admission: Personal Goals and Risk Factors at Admission - 03/04/20 1522      Core Components/Risk Factors/Patient Goals on Admission    Weight Management   Yes    Intervention  Weight Management: Develop a combined nutrition and exercise program designed to reach desired caloric intake, while maintaining appropriate intake of nutrient and fiber, sodium and fats, and appropriate energy expenditure required for the weight goal.;Weight Management: Provide education and appropriate resources to help participant work on and attain dietary goals.    Admit Weight  197 lb 9.6 oz (89.6 kg)    Goal Weight: Short Term  195 lb (88.5 kg)    Goal Weight: Long Term  190 lb (86.2 kg)    Expected Outcomes  Short Term: Continue to assess and modify interventions until short term weight is achieved;Weight Maintenance: Understanding of the daily nutrition guidelines, which includes 25-35% calories from fat, 7% or less cal from saturated fats, less than 215m cholesterol, less than 1.5gm of sodium, & 5 or more servings of fruits and vegetables daily    Diabetes  Yes    Intervention  Provide education about signs/symptoms and action to take for hypo/hyperglycemia.;Provide education about proper nutrition, including  hydration, and aerobic/resistive exercise prescription along with prescribed medications to achieve blood glucose in normal ranges: Fasting glucose 65-99 mg/dL    Expected Outcomes  Short Term: Participant verbalizes understanding of the signs/symptoms and immediate care of hyper/hypoglycemia, proper foot care and importance of medication, aerobic/resistive exercise and nutrition plan for blood glucose control.;Long Term: Attainment of HbA1C < 7%.    Hypertension  Yes    Intervention  Provide education on lifestyle modifcations including regular physical activity/exercise, weight management, moderate sodium restriction and increased consumption of fresh fruit, vegetables, and low fat dairy, alcohol moderation, and smoking cessation.;Monitor prescription use compliance.    Expected Outcomes  Short Term: Continued assessment and intervention until BP is < 140/24m HG in  hypertensive participants. < 130/881mHG in hypertensive participants with diabetes, heart failure or chronic kidney disease.;Long Term: Maintenance of blood pressure at goal levels.    Lipids  Yes    Intervention  Provide education and support for participant on nutrition & aerobic/resistive exercise along with prescribed medications to achieve LDL '70mg'$ , HDL >'40mg'$ .    Expected Outcomes  Short Term: Participant states understanding of desired cholesterol values and is compliant with medications prescribed. Participant is following exercise prescription and nutrition guidelines.;Long Term: Cholesterol controlled with medications as prescribed, with individualized exercise RX and with personalized nutrition plan. Value goals: LDL < '70mg'$ , HDL > 40 mg.       Education:Diabetes - Individual verbal and written instruction to review signs/symptoms of diabetes, desired ranges of glucose level fasting, after meals and with exercise. Acknowledge that pre and post exercise glucose checks will be done for 3 sessions at entry of program.   Education: Know Your Numbers and Risk Factors: -Group verbal and written instruction about important numbers in your health.  Discussion of what are risk factors and how they play a role in the disease process.  Review of Cholesterol, Blood Pressure, Diabetes, and BMI and the role they play in your overall health.   Core Components/Risk Factors/Patient Goals Review:    Core Components/Risk Factors/Patient Goals at Discharge (Final Review):    ITP Comments: ITP Comments    Row Name 03/03/20 1425           ITP Comments  Completed virtual orientation today.  EP evaluation is scheduled for Tuesday 5/18  at 2pm.  [facility]          Comments: initial ITP

## 2020-03-05 ENCOUNTER — Other Ambulatory Visit: Payer: Self-pay

## 2020-03-05 ENCOUNTER — Encounter: Payer: Self-pay | Admitting: *Deleted

## 2020-03-05 ENCOUNTER — Encounter: Payer: Medicare Other | Admitting: *Deleted

## 2020-03-05 DIAGNOSIS — Z955 Presence of coronary angioplasty implant and graft: Secondary | ICD-10-CM

## 2020-03-05 LAB — GLUCOSE, CAPILLARY
Glucose-Capillary: 111 mg/dL — ABNORMAL HIGH (ref 70–99)
Glucose-Capillary: 77 mg/dL (ref 70–99)

## 2020-03-05 NOTE — Progress Notes (Signed)
Cardiac Individual Treatment Plan  Patient Details  Name: Brent Alvarado MRN: [id number] Date of Birth: Nov 15, 1945 Referring Provider:     Cardiac Rehab from 03/04/2020 in [facility] Cardiac and Pulmonary Rehab  Referring Provider  Humphrey Rolls, [place]      Initial Encounter Date:    Cardiac Rehab from 03/04/2020 in [facility] Cardiac and Pulmonary Rehab  Date  03/04/20      Visit Diagnosis: Status post coronary artery stent placement  Patient's Home Medications on Admission:  Current Outpatient Medications:  .  amLODipine (NORVASC) 10 MG tablet, Take 10 mg by mouth daily., Disp: , Rfl:  .  aspirin 81 MG chewable tablet, Chew 1 tablet (81 mg total) by mouth daily. (Patient not taking: Reported on 03/03/2020), Disp: 30 tablet, Rfl: 0 .  baclofen (LIORESAL) 10 MG tablet, Take 10 mg by mouth 3 (three) times daily., Disp: , Rfl:  .  clopidogrel (PLAVIX) 75 MG tablet, Take 1 tablet (75 mg total) by mouth daily with breakfast., Disp: 30 tablet, Rfl: 0 .  Dulaglutide (TRULICITY) 9.93 TT/0.1XB SOPN, Inject into the skin once a week., Disp: , Rfl:  .  fenofibrate 54 MG tablet, Take 54 mg by mouth daily., Disp: , Rfl:  .  furosemide (LASIX) 20 MG tablet, Take 20 mg by mouth daily. , Disp: , Rfl:  .  glimepiride (AMARYL) 4 MG tablet, Take 1 tablet by mouth daily., Disp: , Rfl:  .  losartan (COZAAR) 50 MG tablet, Take 50 mg by mouth every morning., Disp: , Rfl:  .  nitroGLYCERIN (NITROSTAT) 0.4 MG SL tablet, Place 1 tablet (0.4 mg total) under the tongue every 5 (five) minutes x 3 doses as needed for chest pain., Disp: 20 tablet, Rfl: 1 .  ondansetron (ZOFRAN) 4 MG tablet, TAKE 1 TABLET BY MOUTH 3 TIMES DAILY AS NEEDED FOR NAUSEA FOR UP TO 7 DAYS, Disp: , Rfl:  .  pantoprazole (PROTONIX) 40 MG tablet, Take 1 tablet by mouth 2 (two) times daily., Disp: , Rfl:  .  PARoxetine (PAXIL) 40 MG tablet, Take 40 mg by mouth every morning., Disp: , Rfl:  .  polyethylene glycol (MIRALAX / GLYCOLAX) 17 g packet, Take 17 g by  mouth daily., Disp: , Rfl:  .  rosuvastatin (CRESTOR) 40 MG tablet, Take 1 tablet by mouth daily., Disp: , Rfl:  .  sotalol (BETAPACE) 80 MG tablet, Take 80 mg by mouth 2 (two) times daily., Disp: , Rfl:  .  tamsulosin (FLOMAX) 0.4 MG CAPS capsule, Take 0.4 mg by mouth., Disp: , Rfl:  .  XARELTO 15 MG TABS tablet, Take 15 mg by mouth daily., Disp: , Rfl:  No current facility-administered medications for this visit.  Facility-Administered Medications Ordered in Other Visits:  .  0.9 %  sodium chloride infusion, 250 mL, Intravenous, PRN, Neoma Laming A, MD .  sodium chloride flush (NS) 0.9 % injection 3 mL, 3 mL, Intravenous, Q12H, Neoma Laming A, MD .  sodium chloride flush (NS) 0.9 % injection 3 mL, 3 mL, Intravenous, PRN, Neoma Laming A, MD .  sodium chloride flush (NS) 0.9 % injection 3 mL, 3 mL, Intravenous, Q12H, Dionisio David, MD  Past Medical History: Past Medical History:  Diagnosis Date  . Anxiety   . BPH (benign prostatic hyperplasia)   . Depression   . Diabetes mellitus without complication ([place])    Controlled poorly with metformin  . Diverticulitis   . Dyspnea   . Dysrhythmia   . GERD (gastroesophageal reflux disease)   .  Hyperlipidemia   . Hypertension     Tobacco Use: Social History   Tobacco Use  Smoking Status Never Smoker  Smokeless Tobacco Never Used    Labs: Recent Review Flowsheet Data    Labs for ITP Cardiac and Pulmonary Rehab Latest Ref Rng & Units 02/11/2020   Hemoglobin A1c 4.8 - 5.6 % 6.5(H)       Exercise Target Goals: Exercise Program Goal: Individual exercise prescription set using results from initial 6 min walk test and THRR while considering  patient's activity barriers and safety.   Exercise Prescription Goal: Initial exercise prescription builds to 30-45 minutes a day of aerobic activity, 2-3 days per week.  Home exercise guidelines will be given to patient during program as part of exercise prescription that the participant will  acknowledge.   Education: Aerobic Exercise & Resistance Training: - Gives group verbal and written instruction on the various components of exercise. Focuses on aerobic and resistive training programs and the benefits of this training and how to safely progress through these programs..   Education: Exercise & Equipment Safety: - Individual verbal instruction and demonstration of equipment use and safety with use of the equipment.   Cardiac Rehab from 03/04/2020 in [facility] Cardiac and Pulmonary Rehab  Date  03/04/20  Educator  AS  Instruction Review Code  1- Verbalizes Understanding      Education: Exercise Physiology & General Exercise Guidelines: - Group verbal and written instruction with models to review the exercise physiology of the cardiovascular system and associated critical values. Provides general exercise guidelines with specific guidelines to those with heart or lung disease.    Education: Flexibility, Balance, Mind/Body Relaxation: Provides group verbal/written instruction on the benefits of flexibility and balance training, including mind/body exercise modes such as yoga, pilates and tai chi.  Demonstration and skill practice provided.   Activity Barriers & Risk Stratification: Activity Barriers & Cardiac Risk Stratification - 03/03/20 1405      Activity Barriers & Cardiac Risk Stratification   Activity Barriers  Back Problems;Neck/Spine Problems;Arthritis;Deconditioning;Muscular Weakness;Shortness of Breath   chronic low back pain   Cardiac Risk Stratification  Moderate       6 Minute Walk: 6 Minute Walk    Row Name 03/04/20 1512         6 Minute Walk   Phase  Initial     Distance  1098 feet     Walk Time  6 minutes     # of Rest Breaks  0     MPH  2.08     METS  2.13     RPE  13     Perceived Dyspnea   2     VO2 Peak  7.45     Symptoms  Yes (comment)     Comments  hip pain ( been for 30 years)     Resting HR  65 bpm     Resting BP  108/64     Resting  Oxygen Saturation   97 %     Exercise Oxygen Saturation  during 6 min walk  98 %     Max Ex. HR  90 bpm     Max Ex. BP  110/54     2 Minute Post BP  110/64        Oxygen Initial Assessment:   Oxygen Re-Evaluation:   Oxygen Discharge (Final Oxygen Re-Evaluation):   Initial Exercise Prescription: Initial Exercise Prescription - 03/04/20 1500      Date of Initial Exercise  RX and Referring Provider   Date  03/04/20    Referring Provider  Humphrey Rolls, shaukat      Treadmill   MPH  1.5    Grade  0    Minutes  15    METs  2.15      Recumbant Bike   Level  1    RPM  60    Minutes  15    METs  2.13      NuStep   Level  2    SPM  80    Minutes  15    METs  2.13      REL-XR   Level  2    Speed  50    Minutes  15    METs  2.13      Prescription Details   Frequency (times per week)  3    Duration  Progress to 30 minutes of continuous aerobic without signs/symptoms of physical distress      Intensity   THRR 40-80% of Max Heartrate  97-129    Ratings of Perceived Exertion  11-15    Perceived Dyspnea  0-4      Resistance Training   Training Prescription  Yes    Weight  3 lb    Reps  10-15       Perform Capillary Blood Glucose checks as needed.  Exercise Prescription Changes: Exercise Prescription Changes    Row Name 03/04/20 1500             Response to Exercise   Blood Pressure (Admit)  108/64       Blood Pressure (Exercise)  110/54       Blood Pressure (Exit)  110/64       Heart Rate (Admit)  65 bpm       Heart Rate (Exercise)  90 bpm       Heart Rate (Exit)  75 bpm       Oxygen Saturation (Admit)  97 %       Oxygen Saturation (Exercise)  98 %       Rating of Perceived Exertion (Exercise)  13       Perceived Dyspnea (Exercise)  2       Symptoms  hip pain          Exercise Comments:   Exercise Goals and Review: Exercise Goals    Row Name 03/04/20 1516             Exercise Goals   Increase Physical Activity  Yes       Intervention   Provide advice, education, support and counseling about physical activity/exercise needs.;Develop an individualized exercise prescription for aerobic and resistive training based on initial evaluation findings, risk stratification, comorbidities and participant's personal goals.       Expected Outcomes  Short Term: Attend rehab on a regular basis to increase amount of physical activity.;Long Term: Add in home exercise to make exercise part of routine and to increase amount of physical activity.;Long Term: Exercising regularly at least 3-5 days a week.       Increase Strength and Stamina  Yes       Intervention  Provide advice, education, support and counseling about physical activity/exercise needs.;Develop an individualized exercise prescription for aerobic and resistive training based on initial evaluation findings, risk stratification, comorbidities and participant's personal goals.       Expected Outcomes  Short Term: Increase workloads from initial exercise prescription for resistance, speed, and METs.;Short Term: Perform  resistance training exercises routinely during rehab and add in resistance training at home;Long Term: Improve cardiorespiratory fitness, muscular endurance and strength as measured by increased METs and functional capacity (6MWT)       Able to understand and use rate of perceived exertion (RPE) scale  Yes       Intervention  Provide education and explanation on how to use RPE scale       Expected Outcomes  Short Term: Able to use RPE daily in rehab to express subjective intensity level;Long Term:  Able to use RPE to guide intensity level when exercising independently       Able to understand and use Dyspnea scale  Yes       Intervention  Provide education and explanation on how to use Dyspnea scale       Expected Outcomes  Short Term: Able to use Dyspnea scale daily in rehab to express subjective sense of shortness of breath during exertion;Long Term: Able to use Dyspnea scale to  guide intensity level when exercising independently       Knowledge and understanding of Target Heart Rate Range (THRR)  Yes       Intervention  Provide education and explanation of THRR including how the numbers were predicted and where they are located for reference       Expected Outcomes  Short Term: Able to state/look up THRR;Short Term: Able to use daily as guideline for intensity in rehab;Long Term: Able to use THRR to govern intensity when exercising independently       Able to check pulse independently  Yes       Intervention  Provide education and demonstration on how to check pulse in carotid and radial arteries.;Review the importance of being able to check your own pulse for safety during independent exercise       Expected Outcomes  Short Term: Able to explain why pulse checking is important during independent exercise;Long Term: Able to check pulse independently and accurately       Understanding of Exercise Prescription  Yes       Intervention  Provide education, explanation, and written materials on patient's individual exercise prescription       Expected Outcomes  Short Term: Able to explain program exercise prescription;Long Term: Able to explain home exercise prescription to exercise independently          Exercise Goals Re-Evaluation :   Discharge Exercise Prescription (Final Exercise Prescription Changes): Exercise Prescription Changes - 03/04/20 1500      Response to Exercise   Blood Pressure (Admit)  108/64    Blood Pressure (Exercise)  110/54    Blood Pressure (Exit)  110/64    Heart Rate (Admit)  65 bpm    Heart Rate (Exercise)  90 bpm    Heart Rate (Exit)  75 bpm    Oxygen Saturation (Admit)  97 %    Oxygen Saturation (Exercise)  98 %    Rating of Perceived Exertion (Exercise)  13    Perceived Dyspnea (Exercise)  2    Symptoms  hip pain       Nutrition:  Target Goals: Understanding of nutrition guidelines, daily intake of sodium <1560m, cholesterol  <2013m calories 30% from fat and 7% or less from saturated fats, daily to have 5 or more servings of fruits and vegetables.  Education: Controlling Sodium/Reading Food Labels -Group verbal and written material supporting the discussion of sodium use in heart healthy nutrition. Review and explanation with models, verbal and  written materials for utilization of the food label.   Education: General Nutrition Guidelines/Fats and Fiber: -Group instruction provided by verbal, written material, models and posters to present the general guidelines for heart healthy nutrition. Gives an explanation and review of dietary fats and fiber.   Biometrics: Pre Biometrics - 03/04/20 1517      Pre Biometrics   Height  5' 11.5" (1.816 m)    Weight  197 lb 9.6 oz (89.6 kg)    BMI (Calculated)  27.18    Single Leg Stand  7.75 seconds        Nutrition Therapy Plan and Nutrition Goals:   Nutrition Assessments:   MEDIFICTS Score Key:          ?70 Need to make dietary changes          40-70 Heart Healthy Diet         ? 40 Therapeutic Level Cholesterol Diet  Nutrition Goals Re-Evaluation:   Nutrition Goals Discharge (Final Nutrition Goals Re-Evaluation):   Psychosocial: Target Goals: Acknowledge presence or absence of significant depression and/or stress, maximize coping skills, provide positive support system. Participant is able to verbalize types and ability to use techniques and skills needed for reducing stress and depression.   Education: Depression - Provides group verbal and written instruction on the correlation between heart/lung disease and depressed mood, treatment options, and the stigmas associated with seeking treatment.   Education: Sleep Hygiene -Provides group verbal and written instruction about how sleep can affect your health.  Define sleep hygiene, discuss sleep cycles and impact of sleep habits. Review good sleep hygiene tips.     Education: Stress and Anxiety: -  Provides group verbal and written instruction about the health risks of elevated stress and causes of high stress.  Discuss the correlation between heart/lung disease and anxiety and treatment options. Review healthy ways to manage with stress and anxiety.    Initial Review & Psychosocial Screening: Initial Psych Review & Screening - 03/03/20 1406      Initial Review   Current issues with  History of Depression;Current Psychotropic Meds;Current Anxiety/Panic;Current Depression;Current Stress Concerns    Source of Stress Concerns  Chronic Illness    Comments  more depression then anxiety and currently well managed on meds with Dr. Sabra Heck, overall doing well      Reliance?  Yes   wife, daughter (lives within a few miles)     Barriers   Psychosocial barriers to participate in program  The patient should benefit from training in stress management and relaxation.;Psychosocial barriers identified (see note)      Screening Interventions   Interventions  Encouraged to exercise;Provide feedback about the scores to participant;To provide support and resources with identified psychosocial needs    Expected Outcomes  Short Term goal: Utilizing psychosocial counselor, staff and physician to assist with identification of specific Stressors or current issues interfering with healing process. Setting desired goal for each stressor or current issue identified.;Long Term Goal: Stressors or current issues are controlled or eliminated.;Short Term goal: Identification and review with participant of any Quality of Life or Depression concerns found by scoring the questionnaire.;Long Term goal: The participant improves quality of Life and PHQ9 Scores as seen by post scores and/or verbalization of changes       Quality of Life Scores:  Quality of Life - 03/04/20 1518      Quality of Life   Select  Quality of Life  Quality of Life Scores   Health/Function Pre  20.2 %     Socioeconomic Pre  21.07 %    Psych/Spiritual Pre  24.43 %    Family Pre  22.8 %    GLOBAL Pre  21.63 %      Scores of 19 and below usually indicate a poorer quality of life in these areas.  A difference of  2-3 points is a clinically meaningful difference.  A difference of 2-3 points in the total score of the Quality of Life Index has been associated with significant improvement in overall quality of life, self-image, physical symptoms, and general health in studies assessing change in quality of life.  PHQ-9: Recent Review Flowsheet Data    Depression screen [facility] 2/9 03/04/2020 11/20/2018   Decreased Interest 0 0   Down, Depressed, Hopeless 0 0   PHQ - 2 Score 0 0   Altered sleeping 0 -   Tired, decreased energy 2 -   Change in appetite 0 -   Feeling bad or failure about yourself  0 -   Trouble concentrating 0 -   Moving slowly or fidgety/restless 0 -   Suicidal thoughts 0 -   PHQ-9 Score 2 -     Interpretation of Total Score  Total Score Depression Severity:  1-4 = Minimal depression, 5-9 = Mild depression, 10-14 = Moderate depression, 15-19 = Moderately severe depression, 20-27 = Severe depression   Psychosocial Evaluation and Intervention: Psychosocial Evaluation - 03/03/20 1413      Psychosocial Evaluation & Interventions   Interventions  Stress management education;Encouraged to exercise with the program and follow exercise prescription    Comments  Octavia Bruckner is 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  He has a great support system in his wife and daughter (who lives near by).  He really wants to try to avoid any of this happening agin. He has never really exercised consistently before and is curious as to how it will go for him.  He wants to make sure he does not have any drawbacks.  He does have a history of depression with a little bit of anxiety but is currently well managed on plaxil.  Exercise should also help give him a mental boost.    Expected Outcomes   Short: Attend rehab for mood boost with exercise. Long: Get into exercise routine.    Continue Psychosocial Services   Follow up required by staff       Psychosocial Re-Evaluation:   Psychosocial Discharge (Final Psychosocial Re-Evaluation):   Vocational Rehabilitation: Provide vocational rehab assistance to qualifying candidates.   Vocational Rehab Evaluation & Intervention: Vocational Rehab - 03/03/20 1406      Initial Vocational Rehab Evaluation & Intervention   Assessment shows need for Vocational Rehabilitation  No   retired      Education: Education Goals: Education classes will be provided on a variety of topics geared toward better understanding of heart health and risk factor modification. Participant will state understanding/return demonstration of topics presented as noted by education test scores.  Learning Barriers/Preferences: Learning Barriers/Preferences - 03/03/20 1405      Learning Barriers/Preferences   Learning Barriers  Sight   glasses   Learning Preferences  Skilled Demonstration       General Cardiac Education Topics:  AED/CPR: - Group verbal and written instruction with the use of models to demonstrate the basic use of the AED with the basic ABC's of resuscitation.   Anatomy & Physiology  of the Heart: - Group verbal and written instruction and models provide basic cardiac anatomy and physiology, with the coronary electrical and arterial systems. Review of Valvular disease and Heart Failure   Cardiac Procedures: - Group verbal and written instruction to review commonly prescribed medications for heart disease. Reviews the medication, class of the drug, and side effects. Includes the steps to properly store meds and maintain the prescription regimen. (beta blockers and nitrates)   Cardiac Medications I: - Group verbal and written instruction to review commonly prescribed medications for heart disease. Reviews the medication, class of the drug,  and side effects. Includes the steps to properly store meds and maintain the prescription regimen.   Cardiac Medications II: -Group verbal and written instruction to review commonly prescribed medications for heart disease. Reviews the medication, class of the drug, and side effects. (all other drug classes)    Go Sex-Intimacy & Heart Disease, Get SMART - Goal Setting: - Group verbal and written instruction through game format to discuss heart disease and the return to sexual intimacy. Provides group verbal and written material to discuss and apply goal setting through the application of the S.M.A.R.T. Method.   Other Matters of the Heart: - Provides group verbal, written materials and models to describe Stable Angina and Peripheral Artery. Includes description of the disease process and treatment options available to the cardiac patient.   Infection Prevention: - Provides verbal and written material to individual with discussion of infection control including proper hand washing and proper equipment cleaning during exercise session.   Cardiac Rehab from 03/04/2020 in [facility] Cardiac and Pulmonary Rehab  Date  03/04/20  Educator  AS  Instruction Review Code  1- Verbalizes Understanding      Falls Prevention: - Provides verbal and written material to individual with discussion of falls prevention and safety.   Cardiac Rehab from 03/04/2020 in [facility] Cardiac and Pulmonary Rehab  Date  03/04/20  Educator  AS  Instruction Review Code  1- Verbalizes Understanding      Other: -Provides group and verbal instruction on various topics (see comments)   Knowledge Questionnaire Score: Knowledge Questionnaire Score - 03/04/20 1518      Knowledge Questionnaire Score   Pre Score  22/26       Core Components/Risk Factors/Patient Goals at Admission: Personal Goals and Risk Factors at Admission - 03/04/20 1522      Core Components/Risk Factors/Patient Goals on Admission    Weight Management   Yes    Intervention  Weight Management: Develop a combined nutrition and exercise program designed to reach desired caloric intake, while maintaining appropriate intake of nutrient and fiber, sodium and fats, and appropriate energy expenditure required for the weight goal.;Weight Management: Provide education and appropriate resources to help participant work on and attain dietary goals.    Admit Weight  197 lb 9.6 oz (89.6 kg)    Goal Weight: Short Term  195 lb (88.5 kg)    Goal Weight: Long Term  190 lb (86.2 kg)    Expected Outcomes  Short Term: Continue to assess and modify interventions until short term weight is achieved;Weight Maintenance: Understanding of the daily nutrition guidelines, which includes 25-35% calories from fat, 7% or less cal from saturated fats, less than 215m cholesterol, less than 1.5gm of sodium, & 5 or more servings of fruits and vegetables daily    Diabetes  Yes    Intervention  Provide education about signs/symptoms and action to take for hypo/hyperglycemia.;Provide education about proper nutrition, including  hydration, and aerobic/resistive exercise prescription along with prescribed medications to achieve blood glucose in normal ranges: Fasting glucose 65-99 mg/dL    Expected Outcomes  Short Term: Participant verbalizes understanding of the signs/symptoms and immediate care of hyper/hypoglycemia, proper foot care and importance of medication, aerobic/resistive exercise and nutrition plan for blood glucose control.;Long Term: Attainment of HbA1C < 7%.    Hypertension  Yes    Intervention  Provide education on lifestyle modifcations including regular physical activity/exercise, weight management, moderate sodium restriction and increased consumption of fresh fruit, vegetables, and low fat dairy, alcohol moderation, and smoking cessation.;Monitor prescription use compliance.    Expected Outcomes  Short Term: Continued assessment and intervention until BP is < 140/41m HG in  hypertensive participants. < 130/8102mHG in hypertensive participants with diabetes, heart failure or chronic kidney disease.;Long Term: Maintenance of blood pressure at goal levels.    Lipids  Yes    Intervention  Provide education and support for participant on nutrition & aerobic/resistive exercise along with prescribed medications to achieve LDL <7067mHDL >23m25m  Expected Outcomes  Short Term: Participant states understanding of desired cholesterol values and is compliant with medications prescribed. Participant is following exercise prescription and nutrition guidelines.;Long Term: Cholesterol controlled with medications as prescribed, with individualized exercise RX and with personalized nutrition plan. Value goals: LDL < 70mg26mL > 40 mg.       Education:Diabetes - Individual verbal and written instruction to review signs/symptoms of diabetes, desired ranges of glucose level fasting, after meals and with exercise. Acknowledge that pre and post exercise glucose checks will be done for 3 sessions at entry of program.   Education: Know Your Numbers and Risk Factors: -Group verbal and written instruction about important numbers in your health.  Discussion of what are risk factors and how they play a role in the disease process.  Review of Cholesterol, Blood Pressure, Diabetes, and BMI and the role they play in your overall health.   Core Components/Risk Factors/Patient Goals Review:    Core Components/Risk Factors/Patient Goals at Discharge (Final Review):    ITP Comments: ITP Comments    Row Name 03/03/20 1425 03/04/20 1529 03/05/20 0847       ITP Comments  Completed virtual orientation today.  EP evaluation is scheduled for Tuesday 5/18  at 2pm.  [facility]  Completed 6MWT and gym orientation.  Initial ITP created and sent for review to Dr. Mark Emily Filbertical Director.  30 Day review completed. Medical Director review done,  changes made as directed,and approval shown by signature of [employer] researcherw to program        Comments:

## 2020-03-05 NOTE — Progress Notes (Signed)
Daily Session Note  Patient Details  Name: Brent Alvarado MRN: [id number] Date of Birth: [birth year] Referring Provider:     Cardiac Rehab from 03/04/2020 in [facility] Cardiac and Pulmonary Rehab  Referring Provider  Neoma Laming      Encounter Date: 03/05/2020  Check In: Session Check In - 03/05/20 1522      Check-In   Supervising physician immediately available to respond to emergencies  See telemetry face sheet for immediately available ER MD    Location  [facility]-Cardiac & Pulmonary Rehab    Staff Present  Renita Papa, RN Vickki Hearing, BA, ACSM CEP, Exercise Physiologist;Melissa Caiola RDN, LDN    Virtual Visit  No    Medication changes reported      No    Fall or balance concerns reported     No    Warm-up and Cool-down  Performed on first and last piece of equipment    Resistance Training Performed  Yes    VAD Patient?  No    PAD/SET Patient?  No      Pain Assessment   Currently in Pain?  No/denies          Social History   Tobacco Use  Smoking Status Never Smoker  Smokeless Tobacco Never Used    Goals Met:  Independence with exercise equipment Exercise tolerated well No report of cardiac concerns or symptoms Strength training completed today  Goals Unmet:  Not Applicable  Comments: First full day of exercise!  Patient was oriented to gym and equipment including functions, settings, policies, and procedures.  Patient's individual exercise prescription and treatment plan were reviewed.  All starting workloads were established based on the results of the 6 minute walk test done at initial orientation visit.  The plan for exercise progression was also introduced and progression will be customized based on patient's performance and goals.    Dr. Emily Filbert is Medical Director for [facility] and [facility].

## 2020-03-06 ENCOUNTER — Encounter: Payer: Medicare Other | Admitting: *Deleted

## 2020-03-06 ENCOUNTER — Other Ambulatory Visit: Payer: Self-pay

## 2020-03-06 DIAGNOSIS — Z955 Presence of coronary angioplasty implant and graft: Secondary | ICD-10-CM

## 2020-03-06 LAB — GLUCOSE, CAPILLARY
Glucose-Capillary: 109 mg/dL — ABNORMAL HIGH (ref 70–99)
Glucose-Capillary: 92 mg/dL (ref 70–99)

## 2020-03-06 NOTE — Progress Notes (Signed)
Daily Session Note  Patient Details  Name: ELVA BREAKER MRN: [id number] Date of Birth: [birth year] Referring Provider:     Cardiac Rehab from 03/04/2020 in [facility] Cardiac and Pulmonary Rehab  Referring Provider  Neoma Laming      Encounter Date: 03/06/2020  Check In: Session Check In - 03/06/20 1529      Check-In   Supervising physician immediately available to respond to emergencies  See telemetry face sheet for immediately available ER MD    Location  [facility]Cardiac [facility] Pul[facility] Re[facility]    Staff Present  Renita Papa, RN BSN;Jessica Luan Pulling, MA, RCEP, CCRP, CCET;Joseph [place] RCP,RRT,BSRT    Virtual Visit  No    Medication changes reported      No    Fall or balance concerns reported     No    Warm-up and Cool-down  Performed on first and last piece of equipment    Resistance Training Performed  Yes    VAD Patient?  No    PAD/SET Patient?  No      Pain Assessment   Currently in Pain?  No/denies          Social History   Tobacco Use  Smoking Status Never Smoker  Smokeless Tobacco Never Used    Goals Met:  Independence with exercise equipment Exercise tolerated well No report of cardiac concerns or symptoms Strength training completed today  Goals Unmet:  Not Applicable  Comments: Pt able to follow exercise prescription today without complaint.  Will continue to monitor for progression.    Dr. Emily Filbert is Medical Director for [place] and [facility].

## 2020-03-10 ENCOUNTER — Other Ambulatory Visit: Payer: Self-pay

## 2020-03-10 ENCOUNTER — Encounter: Payer: Medicare Other | Admitting: *Deleted

## 2020-03-10 DIAGNOSIS — Z955 Presence of coronary angioplasty implant and graft: Secondary | ICD-10-CM

## 2020-03-10 LAB — GLUCOSE, CAPILLARY
Glucose-Capillary: 104 mg/dL — ABNORMAL HIGH (ref 70–99)
Glucose-Capillary: 60 mg/dL — ABNORMAL LOW (ref 70–99)
Glucose-Capillary: 82 mg/dL (ref 70–99)

## 2020-03-10 NOTE — Progress Notes (Signed)
Daily Session Note  Patient Details  Name: Brent Alvarado MRN: [id number] Date of Birth: 01/22/1946 Referring Provider:     Cardiac Rehab from 03/04/2020 in [facility] Cardiac and Pulmonary Rehab  Referring Provider  Neoma Laming      Encounter Date: 03/10/2020  Check In: Session Check In - 03/10/20 1522      Check-In   Supervising physician immediately available to respond to emergencies  See telemetry face sheet for immediately available ER MD    Location  [facility]Cardiac & Pulmonary Rehab    Staff Present  Renita Papa, RN BSN;Joseph [address] [place], [place], ACSM CEP, Exercise Physiologist    Virtual Visit  No    Medication changes reported      No    Fall or balance concerns reported     No    Warm-up and Cool-down  Performed on first and last piece of equipment    Resistance Training Performed  Yes    VAD Patient?  No    PAD/SET Patient?  No      Pain Assessment   Currently in Pain?  No/denies          Social History   Tobacco Use  Smoking Status Never Smoker  Smokeless Tobacco Never Used    Goals Met:  Independence with exercise equipment Exercise tolerated well No report of cardiac concerns or symptoms Strength training completed today  Goals Unmet:  Not Applicable  Comments: Pt able to follow exercise prescription today without complaint.  Will continue to monitor for progression.    Dr. Emily Filbert is Medical Director for [facility] and [facility].

## 2020-03-12 ENCOUNTER — Encounter: Payer: Medicare Other | Admitting: *Deleted

## 2020-03-12 ENCOUNTER — Other Ambulatory Visit: Payer: Self-pay

## 2020-03-12 DIAGNOSIS — Z955 Presence of coronary angioplasty implant and graft: Secondary | ICD-10-CM | POA: Diagnosis not present

## 2020-03-12 LAB — GLUCOSE, CAPILLARY
Glucose-Capillary: 96 mg/dL (ref 70–99)
Glucose-Capillary: 111 mg/dL — ABNORMAL HIGH (ref 70–99)

## 2020-03-12 NOTE — Progress Notes (Signed)
Daily Session Note  Patient Details  Name: Brent Alvarado MRN: [id number] Date of Birth: Nov 10, 1945 Referring Provider:     Cardiac Rehab from 03/04/2020 in [facility] Cardiac and Pulmonary Rehab  Referring Provider  Neoma Laming      Encounter Date: 03/12/2020  Check In: Session Check In - 03/12/20 1529      Check-In   Supervising physician immediately available to respond to emergencies  See telemetry face sheet for immediately available ER MD    Location  [facility]Cardiac [facility] Pul[facility] Rehab    Staff Present  Renita Papa, RN BSN;Melissa Caiola RDN, Rowe Pavy, BA, ACSM CEP, Exercise Physiologist    Virtual Visit  No    Medication changes reported      No    Fall or balance concerns reported     No    Warm-up and Cool-down  Performed on first and last piece of equipment    Resistance Training Performed  Yes    VAD Patient?  No    PAD/SET Patient?  No      Pain Assessment   Currently in Pain?  No/denies          Social History   Tobacco Use  Smoking Status Never Smoker  Smokeless Tobacco Never Used    Goals Met:  Independence with exercise equipment Exercise tolerated well No report of cardiac concerns or symptoms Strength training completed today  Goals Unmet:  Not Applicable  Comments: Pt able to follow exercise prescription today without complaint.  Will continue to monitor for progression.    Dr. Emily Filbert is Medical Director for [place] and [facility].

## 2020-03-13 ENCOUNTER — Encounter: Payer: Medicare Other | Admitting: *Deleted

## 2020-03-13 ENCOUNTER — Other Ambulatory Visit: Payer: Self-pay

## 2020-03-13 DIAGNOSIS — Z955 Presence of coronary angioplasty implant and graft: Secondary | ICD-10-CM

## 2020-03-13 NOTE — Progress Notes (Signed)
Daily Session Note  Patient Details  Name: Brent Alvarado MRN: [id number] Date of Birth: [birth year] Referring Provider:     Cardiac Rehab from 03/04/2020 in [facility] Cardiac and Pulmonary Rehab  Referring Provider  Neoma Laming      Encounter Date: 03/13/2020  Check In: Session Check In - 03/13/20 1525      Check-In   Supervising physician immediately available to respond to emergencies  See telemetry face sheet for immediately available ER MD    Location  [facility]    Staff Present  Renita Papa, RN BSN;Joseph Foy [place], [place], ACSM CEP, Exercise Physiologist    Virtual Visit  No    Medication changes reported      No    Fall or balance concerns reported     No    Warm-up and Cool-down  Performed on first and last piece of equipment    Resistance Training Performed  Yes    VAD Patient?  No    PAD/SET Patient?  No      Pain Assessment   Currently in Pain?  No/denies          Social History   Tobacco Use  Smoking Status Never Smoker  Smokeless Tobacco Never Used    Goals Met:  Independence with exercise equipment Exercise tolerated well No report of cardiac concerns or symptoms Strength training completed today  Goals Unmet:  Not Applicable  Comments: Pt able to follow exercise prescription today without complaint.  Will continue to monitor for progression.    Dr. Emily Filbert is Medical Director for [place] and [facility].

## 2020-03-19 ENCOUNTER — Encounter: Payer: Medicare Other | Attending: Cardiovascular Disease | Admitting: *Deleted

## 2020-03-19 ENCOUNTER — Other Ambulatory Visit: Payer: Self-pay

## 2020-03-19 DIAGNOSIS — Z955 Presence of coronary angioplasty implant and graft: Secondary | ICD-10-CM

## 2020-03-19 DIAGNOSIS — Z79899 Other long term (current) drug therapy: Secondary | ICD-10-CM | POA: Diagnosis not present

## 2020-03-19 NOTE — Progress Notes (Signed)
Daily Session Note  Patient Details  Name: Brent Alvarado MRN: [id number] Date of Birth: June 03, 1946 Referring Provider:     Cardiac Rehab from 03/04/2020 in [facility] Cardiac and Pulmonary Rehab  Referring Provider  Neoma Laming      Encounter Date: 03/19/2020  Check In: Session Check In - 03/19/20 1520      Check-In   Supervising physician immediately available to respond to emergencies  See telemetry face sheet for immediately available ER MD    Location  [facility] Re[facility]    Staff Present  Renita Papa, RN BSN;Melissa Caiola RDN, Rowe Pavy, BA, ACSM CEP, Exercise Physiologist    Virtual Visit  No    Medication changes reported      No    Fall or balance concerns reported     No    Warm-up and Cool-down  Performed on first and last piece of equipment    Resistance Training Performed  Yes    VAD Patient?  No    PAD/SET Patient?  No      Pain Assessment   Currently in Pain?  No/denies          Social History   Tobacco Use  Smoking Status Never Smoker  Smokeless Tobacco Never Used    Goals Met:  Independence with exercise equipment Exercise tolerated well No report of cardiac concerns or symptoms Strength training completed today  Goals Unmet:  Not Applicable  Comments: Pt able to follow exercise prescription today without complaint.  Will continue to monitor for progression.    Dr. Emily Filbert is Medical Director for [place] and [facility].

## 2020-03-20 ENCOUNTER — Encounter: Payer: Medicare Other | Admitting: *Deleted

## 2020-03-20 ENCOUNTER — Other Ambulatory Visit: Payer: Self-pay

## 2020-03-20 DIAGNOSIS — Z955 Presence of coronary angioplasty implant and graft: Secondary | ICD-10-CM

## 2020-03-20 NOTE — Progress Notes (Signed)
Daily Session Note  Patient Details  Name: Brent Alvarado MRN: [id number] Date of Birth: 01/09/1946 Referring Provider:     Cardiac Rehab from 03/04/2020 in [facility] Cardiac and Pulmonary Rehab  Referring Provider  Neoma Laming      Encounter Date: 03/20/2020  Check In: Session Check In - 03/20/20 1533      Check-In   Supervising physician immediately available to respond to emergencies  See telemetry face sheet for immediately available ER MD    Location  [facility] Re[facility]    Staff Present  Renita Papa, RN BSN;Laureen Owens Shark, BS, RRT, CPFT;Amanda Oletta Darter, BA, ACSM CEP, Exercise Physiologist    Virtual Visit  No    Medication changes reported      No    Fall or balance concerns reported     No    Warm-up and Cool-down  Performed on first and last piece of equipment    Resistance Training Performed  Yes    VAD Patient?  No    PAD/SET Patient?  No      Pain Assessment   Currently in Pain?  No/denies          Social History   Tobacco Use  Smoking Status Never Smoker  Smokeless Tobacco Never Used    Goals Met:  Independence with exercise equipment Exercise tolerated well No report of cardiac concerns or symptoms Strength training completed today  Goals Unmet:  Not Applicable  Comments: Pt able to follow exercise prescription today without complaint.  Will continue to monitor for progression.    Dr. Emily Filbert is Medical Director for [place] and [facility].

## 2020-03-24 ENCOUNTER — Other Ambulatory Visit: Payer: Self-pay

## 2020-03-24 DIAGNOSIS — Z955 Presence of coronary angioplasty implant and graft: Secondary | ICD-10-CM | POA: Diagnosis not present

## 2020-03-24 NOTE — Progress Notes (Signed)
Daily Session Note  Patient Details  Name: Brent Alvarado MRN: [id number] Date of Birth: [birth year] Referring Provider:     Cardiac Rehab from 03/04/2020 in [facility] Cardiac and Pulmonary Rehab  Referring Provider  Neoma Laming      Encounter Date: 03/24/2020  Check In: Session Check In - 03/24/20 1620      Check-In   Supervising physician immediately available to respond to emergencies  See telemetry face sheet for immediately available ER MD    Staff Present  Basilia Jumbo, RN, BSN;Melissa Caiola RDN, LDN;Jessica [place], MA, RCEP, CCRP, [place], BS, ACSM CEP, Exercise Physiologist;Kara [place], [place] Exercise Physiologist;Joseph Tessie Fass RCP,RRT,BSRT    Virtual Visit  No    Medication changes reported      No    Fall or balance concerns reported     No    Tobacco Cessation  No Change    Warm-up and Cool-down  Performed on first and last piece of equipment    Resistance Training Performed  Yes    VAD Patient?  No    PAD/SET Patient?  No      Pain Assessment   Currently in Pain?  No/denies          Social History   Tobacco Use  Smoking Status Never Smoker  Smokeless Tobacco Never Used    Goals Met:  Independence with exercise equipment Exercise tolerated well No report of cardiac concerns or symptoms  Goals Unmet:  Not Applicable  Comments: Pt able to follow exercise prescription today without complaint.  Will continue to monitor for progression.   Dr. Emily Filbert is Medical Director for [place] and [facility].

## 2020-03-26 ENCOUNTER — Other Ambulatory Visit: Payer: Self-pay

## 2020-03-26 ENCOUNTER — Encounter: Payer: Medicare Other | Admitting: *Deleted

## 2020-03-26 DIAGNOSIS — Z955 Presence of coronary angioplasty implant and graft: Secondary | ICD-10-CM

## 2020-03-26 NOTE — Progress Notes (Signed)
Daily Session Note  Patient Details  Name: Brent Alvarado MRN: [id number] Date of Birth: 11/21/1945 Referring Provider:     Cardiac Rehab from 03/04/2020 in [facility] Cardiac and Pulmonary Rehab  Referring Provider  Neoma Laming      Encounter Date: 03/26/2020  Check In: Session Check In - 03/26/20 1614      Check-In   Supervising physician immediately available to respond to emergencies  See telemetry face sheet for immediately available ER MD    Location  [facility]iac [facility] Pulmonary Re[facility]    Staff Present  Heath Lark, RN, BSN, CCRP;Laureen Owens Shark, BS, RRT, CPFT;Melissa Caiola RDN, LDN    Virtual Visit  No    Medication changes reported      No    Fall or balance concerns reported     No    Warm-up and Cool-down  Performed on first and last piece of equipment    Resistance Training Performed  Yes    VAD Patient?  No    PAD/SET Patient?  No      Pain Assessment   Currently in Pain?  No/denies          Social History   Tobacco Use  Smoking Status Never Smoker  Smokeless Tobacco Never Used    Goals Met:  Independence with exercise equipment Exercise tolerated well No report of cardiac concerns or symptoms  Goals Unmet:  Not Applicable  Comments: Pt able to follow exercise prescription today without complaint.  Will continue to monitor for progression.    Dr. Emily Filbert is Medical Director for [place] and [facility].

## 2020-03-27 ENCOUNTER — Other Ambulatory Visit: Payer: Self-pay

## 2020-03-27 DIAGNOSIS — Z955 Presence of coronary angioplasty implant and graft: Secondary | ICD-10-CM

## 2020-03-27 NOTE — Progress Notes (Signed)
Daily Session Note  Patient Details  Name: Brent Alvarado MRN: [id number] Date of Birth: Sep 04, 1946 Referring Provider:     Cardiac Rehab from 03/04/2020 in [facility] Cardiac and Pulmonary Rehab  Referring Provider Neoma Laming      Encounter Date: 03/27/2020  Check In:  Session Check In - 03/27/20 1544      Check-In   Supervising physician immediately available to respond to emergencies See telemetry face sheet for immediately available ER MD    Location [facility]Cardiac [facility] Pulmonary Rehab    Staff Present Justin Mend RCP,RRT,BSRT;Jessica [place], MA, RCEP, CCRP, Marylynn Pearson, [place] Exercise Physiologist;Micaela Stith Desma Maxim RN, BSN    Virtual Visit No    Fall or balance concerns reported    No    Warm-up and Cool-down Performed on first and last piece of equipment    Resistance Training Performed Yes    VAD Patient? No    PAD/SET Patient? No      Pain Assessment   Currently in Pain? No/denies              Social History   Tobacco Use  Smoking Status Never Smoker  Smokeless Tobacco Never Used    Goals Met:  Proper associated with RPD/PD & O2 Sat Independence with exercise equipment Exercise tolerated well No report of cardiac concerns or symptoms Strength training completed today  Goals Unmet:  Not Applicable  Comments: Pt able to follow exercise prescription today without complaint.  Will continue to monitor for progression.  Reviewed home exercise with pt today.  Pt plans to walk and staff videos at home for exercise.  Reviewed THR, pulse, RPE, sign and symptoms, pulse oximetery and when to call 911 or MD.  Also discussed weather considerations and indoor options.  Pt voiced understanding.   Dr. Emily Filbert is Medical Director for [name] and [facility].

## 2020-03-31 ENCOUNTER — Other Ambulatory Visit: Payer: Self-pay

## 2020-03-31 ENCOUNTER — Encounter: Payer: Medicare Other | Admitting: *Deleted

## 2020-03-31 DIAGNOSIS — Z955 Presence of coronary angioplasty implant and graft: Secondary | ICD-10-CM

## 2020-03-31 NOTE — Progress Notes (Signed)
Daily Session Note  Patient Details  Name: Brent Alvarado MRN: [id number] Date of Birth: Apr 10, 1946 Referring Provider:     Cardiac Rehab from 03/04/2020 in [facility] Cardiac and Pulmonary Rehab  Referring Provider Neoma Laming      Encounter Date: 03/31/2020  Check In:  Session Check In - 03/31/20 1532      Check-In   Supervising physician immediately available to respond to emergencies See telemetry face sheet for immediately available ER MD    Location [facility]Cardiac [facility] Pulmonary Rehab    Staff Present Renita Papa, RN BSN;Joseph [address] [place], [place], ACSM CEP, Exercise Physiologist;Jessica [place], [place], RCEP, CCRP, CCET    Virtual Visit No    Medication changes reported     No    Fall or balance concerns reported    No    Warm-up and Cool-down Performed on first and last piece of equipment    Resistance Training Performed Yes    VAD Patient? No    PAD/SET Patient? No      Pain Assessment   Currently in Pain? No/denies              Social History   Tobacco Use  Smoking Status Never Smoker  Smokeless Tobacco Never Used    Goals Met:  Independence with exercise equipment Exercise tolerated well No report of cardiac concerns or symptoms Strength training completed today  Goals Unmet:  Not Applicable  Comments: Pt able to follow exercise prescription today without complaint.  Will continue to monitor for progression.    Dr. Emily Filbert is Medical Director for [place] and [facility].

## 2020-04-02 ENCOUNTER — Encounter: Payer: Medicare Other | Admitting: *Deleted

## 2020-04-02 ENCOUNTER — Other Ambulatory Visit: Payer: Self-pay

## 2020-04-02 ENCOUNTER — Encounter: Payer: Self-pay | Admitting: *Deleted

## 2020-04-02 DIAGNOSIS — Z955 Presence of coronary angioplasty implant and graft: Secondary | ICD-10-CM | POA: Diagnosis not present

## 2020-04-02 NOTE — Progress Notes (Signed)
Daily Session Note  Patient Details  Name: VANESSA ALESI MRN: [id number] Date of Birth: [birth year] Referring Provider:     Cardiac Rehab from 03/04/2020 in [facility] Cardiac and Pulmonary Rehab  Referring Provider Neoma Laming      Encounter Date: 04/02/2020  Check In:  Session Check In - 04/02/20 1538      Check-In   Supervising physician immediately available to respond to emergencies See telemetry face sheet for immediately available ER MD    Location [facility]Cardiac [facility] Pulmonary Rehab    Staff Present Renita Papa, RN BSN;Melissa Caiola RDN, Rowe Pavy, BA, ACSM CEP, Exercise Physiologist    Virtual Visit No    Medication changes reported     No    Fall or balance concerns reported    No    Warm-up and Cool-down Performed on first and last piece of equipment    Resistance Training Performed Yes    VAD Patient? No    PAD/SET Patient? No      Pain Assessment   Currently in Pain? No/denies              Social History   Tobacco Use  Smoking Status Never Smoker  Smokeless Tobacco Never Used    Goals Met:  Independence with exercise equipment Exercise tolerated well No report of cardiac concerns or symptoms Strength training completed today  Goals Unmet:  Not Applicable  Comments: Pt able to follow exercise prescription today without complaint.  Will continue to monitor for progression.    Dr. Emily Filbert is Medical Director for [place] and [facility].

## 2020-04-02 NOTE — Progress Notes (Signed)
Cardiac Individual Treatment Plan  Patient Details  Name: Brent Alvarado MRN: [id number] Date of Birth: Aug 31, 1946 Referring Provider:     Cardiac Rehab from 03/04/2020 in [facility] Cardiac and Pulmonary Rehab  Referring Provider Humphrey Rolls, [place]      Initial Encounter Date:    Cardiac Rehab from 03/04/2020 in [facility] Cardiac and Pulmonary Rehab  Date 03/04/20      Visit Diagnosis: Status post coronary artery stent placement  Patient's Home Medications on Admission:  Current Outpatient Medications:  .  amLODipine (NORVASC) 10 MG tablet, Take 10 mg by mouth daily., Disp: , Rfl:  .  aspirin 81 MG chewable tablet, Chew 1 tablet (81 mg total) by mouth daily. (Patient not taking: Reported on 03/03/2020), Disp: 30 tablet, Rfl: 0 .  baclofen (LIORESAL) 10 MG tablet, Take 10 mg by mouth 3 (three) times daily., Disp: , Rfl:  .  clopidogrel (PLAVIX) 75 MG tablet, Take 1 tablet (75 mg total) by mouth daily with breakfast., Disp: 30 tablet, Rfl: 0 .  Dulaglutide (TRULICITY) 1.02 VO/5.3GU SOPN, Inject into the skin once a week., Disp: , Rfl:  .  fenofibrate 54 MG tablet, Take 54 mg by mouth daily., Disp: , Rfl:  .  furosemide (LASIX) 20 MG tablet, Take 20 mg by mouth daily. , Disp: , Rfl:  .  glimepiride (AMARYL) 4 MG tablet, Take 1 tablet by mouth daily., Disp: , Rfl:  .  losartan (COZAAR) 50 MG tablet, Take 50 mg by mouth every morning., Disp: , Rfl:  .  nitroGLYCERIN (NITROSTAT) 0.4 MG SL tablet, Place 1 tablet (0.4 mg total) under the tongue every 5 (five) minutes x 3 doses as needed for chest pain., Disp: 20 tablet, Rfl: 1 .  ondansetron (ZOFRAN) 4 MG tablet, TAKE 1 TABLET BY MOUTH 3 TIMES DAILY AS NEEDED FOR NAUSEA FOR UP TO 7 DAYS, Disp: , Rfl:  .  pantoprazole (PROTONIX) 40 MG tablet, Take 1 tablet by mouth 2 (two) times daily., Disp: , Rfl:  .  PARoxetine (PAXIL) 40 MG tablet, Take 40 mg by mouth every morning., Disp: , Rfl:  .  polyethylene glycol (MIRALAX / GLYCOLAX) 17 g packet, Take 17 g by  mouth daily., Disp: , Rfl:  .  rosuvastatin (CRESTOR) 40 MG tablet, Take 1 tablet by mouth daily., Disp: , Rfl:  .  sotalol (BETAPACE) 80 MG tablet, Take 80 mg by mouth 2 (two) times daily., Disp: , Rfl:  .  tamsulosin (FLOMAX) 0.4 MG CAPS capsule, Take 0.4 mg by mouth., Disp: , Rfl:  .  XARELTO 15 MG TABS tablet, Take 15 mg by mouth daily., Disp: , Rfl:  No current facility-administered medications for this visit.  Facility-Administered Medications Ordered in Other Visits:  .  0.9 %  sodium chloride infusion, 250 mL, Intravenous, PRN, Neoma Laming A, MD .  sodium chloride flush (NS) 0.9 % injection 3 mL, 3 mL, Intravenous, Q12H, Neoma Laming A, MD .  sodium chloride flush (NS) 0.9 % injection 3 mL, 3 mL, Intravenous, PRN, Neoma Laming A, MD .  sodium chloride flush (NS) 0.9 % injection 3 mL, 3 mL, Intravenous, Q12H, Dionisio David, MD  Past Medical History: Past Medical History:  Diagnosis Date  . Anxiety   . BPH (benign prostatic hyperplasia)   . Depression   . Diabetes mellitus without complication ([place])    Controlled poorly with metformin  . Diverticulitis   . Dyspnea   . Dysrhythmia   . GERD (gastroesophageal reflux disease)   .  Hyperlipidemia   . Hypertension     Tobacco Use: Social History   Tobacco Use  Smoking Status Never Smoker  Smokeless Tobacco Never Used    Labs: Recent Review Flowsheet Data    Labs for ITP Cardiac and Pulmonary Rehab Latest Ref Rng & Units 02/11/2020   Hemoglobin A1c 4.8 - 5.6 % 6.5(H)       Exercise Target Goals: Exercise Program Goal: Individual exercise prescription set using results from initial 6 min walk test and THRR while considering  patient's activity barriers and safety.   Exercise Prescription Goal: Initial exercise prescription builds to 30-45 minutes a day of aerobic activity, 2-3 days per week.  Home exercise guidelines will be given to patient during program as part of exercise prescription that the participant will  acknowledge.   Education: Aerobic Exercise & Resistance Training: - Gives group verbal and written instruction on the various components of exercise. Focuses on aerobic and resistive training programs and the benefits of this training and how to safely progress through these programs..   Education: Exercise & Equipment Safety: - Individual verbal instruction and demonstration of equipment use and safety with use of the equipment.   Cardiac Rehab from 03/04/2020 in [facility] Cardiac and Pulmonary Rehab  Date 03/04/20  Educator AS  Instruction Review Code 1- Verbalizes Understanding      Education: Exercise Physiology & General Exercise Guidelines: - Group verbal and written instruction with models to review the exercise physiology of the cardiovascular system and associated critical values. Provides general exercise guidelines with specific guidelines to those with heart or lung disease.    Education: Flexibility, Balance, Mind/Body Relaxation: Provides group verbal/written instruction on the benefits of flexibility and balance training, including mind/body exercise modes such as yoga, pilates and tai chi.  Demonstration and skill practice provided.   Activity Barriers & Risk Stratification:  Activity Barriers & Cardiac Risk Stratification - 03/03/20 1405      Activity Barriers & Cardiac Risk Stratification   Activity Barriers Back Problems;Neck/Spine Problems;Arthritis;Deconditioning;Muscular Weakness;Shortness of Breath   chronic low back pain   Cardiac Risk Stratification Moderate           6 Minute Walk:  6 Minute Walk    Row Name 03/04/20 1512         6 Minute Walk   Phase Initial     Distance 1098 feet     Walk Time 6 minutes     # of Rest Breaks 0     MPH 2.08     METS 2.13     RPE 13     Perceived Dyspnea  2     VO2 Peak 7.45     Symptoms Yes (comment)     Comments hip pain ( been for 30 years)     Resting HR 65 bpm     Resting BP 108/64     Resting Oxygen  Saturation  97 %     Exercise Oxygen Saturation  during 6 min walk 98 %     Max Ex. HR 90 bpm     Max Ex. BP 110/54     2 Minute Post BP 110/64            Oxygen Initial Assessment:   Oxygen Re-Evaluation:   Oxygen Discharge (Final Oxygen Re-Evaluation):   Initial Exercise Prescription:  Initial Exercise Prescription - 03/04/20 1500      Date of Initial Exercise RX and Referring Provider   Date 03/04/20    Referring  Provider Humphrey Rolls, [place]      Treadmill   MPH 1.5    Grade 0    Minutes 15    METs 2.15      Recumbant Bike   Level 1    RPM 60    Minutes 15    METs 2.13      NuStep   Level 2    SPM 80    Minutes 15    METs 2.13      REL-XR   Level 2    Speed 50    Minutes 15    METs 2.13      Prescription Details   Frequency (times per week) 3    Duration Progress to 30 minutes of continuous aerobic without signs/symptoms of physical distress      Intensity   THRR 40-80% of Max Heartrate 97-129    Ratings of Perceived Exertion 11-15    Perceived Dyspnea 0-4      Resistance Training   Training Prescription Yes    Weight 3 lb    Reps 10-15           Perform Capillary Blood Glucose checks as needed.  Exercise Prescription Changes:  Exercise Prescription Changes    Row Name 03/04/20 1500 03/13/20 1500 03/28/20 0800         Response to Exercise   Blood Pressure (Admit) 108/64 116/60 128/74     Blood Pressure (Exercise) 110/54 120/64 140/74     Blood Pressure (Exit) 110/64 100/50 106/64     Heart Rate (Admit) 65 bpm 61 bpm 68 bpm     Heart Rate (Exercise) 90 bpm 86 bpm 104 bpm     Heart Rate (Exit) 75 bpm 77 bpm 73 bpm     Oxygen Saturation (Admit) 97 % -- --     Oxygen Saturation (Exercise) 98 % -- --     Rating of Perceived Exertion (Exercise) 13 13 13      Perceived Dyspnea (Exercise) 2 -- --     Symptoms hip pain none none     Duration -- Progress to 30 minutes of  aerobic without signs/symptoms of physical distress Continue with 30 min  of aerobic exercise without signs/symptoms of physical distress.     Intensity -- THRR unchanged THRR unchanged       Progression   Progression -- Continue to progress workloads to maintain intensity without signs/symptoms of physical distress. Continue to progress workloads to maintain intensity without signs/symptoms of physical distress.     Average METs -- 2.35 2.08       Resistance Training   Training Prescription -- Yes Yes     Weight -- 3 lb 3 lb     Reps -- 10-15 10-15       Interval Training   Interval Training -- No No       Treadmill   MPH -- 1.3 1.2     Grade -- 0 0.5     Minutes -- 15 15     METs -- 2 2       Recumbant Bike   Level -- -- 1     Watts -- -- 16     Minutes -- -- 15     METs -- -- 2.15       NuStep   Level -- 2 3     Minutes -- 15 15     METs -- 2.7 2.6       REL-XR   Level --  2 2     Minutes -- 15 15     METs -- -- 1.2       Home Exercise Plan   Plans to continue exercise at -- -- Home (comment)  walking and staff videos     Frequency -- -- Add 2 additional days to program exercise sessions.     Initial Home Exercises Provided -- -- 03/27/20            Exercise Comments:   Exercise Goals and Review:  Exercise Goals    Row Name 03/04/20 1516             Exercise Goals   Increase Physical Activity Yes       Intervention Provide advice, education, support and counseling about physical activity/exercise needs.;Develop an individualized exercise prescription for aerobic and resistive training based on initial evaluation findings, risk stratification, comorbidities and participant's personal goals.       Expected Outcomes Short Term: Attend rehab on a regular basis to increase amount of physical activity.;Long Term: Add in home exercise to make exercise part of routine and to increase amount of physical activity.;Long Term: Exercising regularly at least 3-5 days a week.       Increase Strength and Stamina Yes       Intervention  Provide advice, education, support and counseling about physical activity/exercise needs.;Develop an individualized exercise prescription for aerobic and resistive training based on initial evaluation findings, risk stratification, comorbidities and participant's personal goals.       Expected Outcomes Short Term: Increase workloads from initial exercise prescription for resistance, speed, and METs.;Short Term: Perform resistance training exercises routinely during rehab and add in resistance training at home;Long Term: Improve cardiorespiratory fitness, muscular endurance and strength as measured by increased METs and functional capacity (6MWT)       Able to understand and use rate of perceived exertion (RPE) scale Yes       Intervention Provide education and explanation on how to use RPE scale       Expected Outcomes Short Term: Able to use RPE daily in rehab to express subjective intensity level;Long Term:  Able to use RPE to guide intensity level when exercising independently       Able to understand and use Dyspnea scale Yes       Intervention Provide education and explanation on how to use Dyspnea scale       Expected Outcomes Short Term: Able to use Dyspnea scale daily in rehab to express subjective sense of shortness of breath during exertion;Long Term: Able to use Dyspnea scale to guide intensity level when exercising independently       Knowledge and understanding of Target Heart Rate Range (THRR) Yes       Intervention Provide education and explanation of THRR including how the numbers were predicted and where they are located for reference       Expected Outcomes Short Term: Able to state/look up THRR;Short Term: Able to use daily as guideline for intensity in rehab;Long Term: Able to use THRR to govern intensity when exercising independently       Able to check pulse independently Yes       Intervention Provide education and demonstration on how to check pulse in carotid and radial  arteries.;Review the importance of being able to check your own pulse for safety during independent exercise       Expected Outcomes Short Term: Able to explain why pulse checking is important during independent exercise;Long Term:  Able to check pulse independently and accurately       Understanding of Exercise Prescription Yes       Intervention Provide education, explanation, and written materials on patient's individual exercise prescription       Expected Outcomes Short Term: Able to explain program exercise prescription;Long Term: Able to explain home exercise prescription to exercise independently              Exercise Goals Re-Evaluation :  Exercise Goals Re-Evaluation    Row Name 03/05/20 1523 03/13/20 1556 03/27/20 1553         Exercise Goal Re-Evaluation   Exercise Goals Review Increase Physical Activity;Knowledge and understanding of Target Heart Rate Range (THRR);Able to understand and use rate of perceived exertion (RPE) scale;Understanding of Exercise Prescription;Increase Strength and Stamina;Able to check pulse independently Increase Physical Activity;Increase Strength and Stamina;Understanding of Exercise Prescription Increase Physical Activity;Increase Strength and Stamina;Understanding of Exercise Prescription     Comments Reviewed RPE and dyspnea scales, THR and program prescription with pt today.  Pt voiced understanding and was given a copy of goals to take home. Octavia Bruckner is off to a good start in rehab.  He has taken to the exercise very well with his frist four days of exercise.  We will continue to monitor her progress. Octavia Bruckner is doing well in rehab.  He is starting to notice that his strength and stamina are starting to recover.  He has also noticed that his breathing is getting better as it is easier to do things.  Reviewed home exercise with pt today.  Pt plans to walk and staff videos at home for exercise.  Reviewed THR, pulse, RPE, sign and symptoms, pulse oximetery and when  to call 911 or MD.  Also discussed weather considerations and indoor options.  Pt voiced understanding.     Expected Outcomes Short: Use RPE daily to regulate intensity. Long: Follow program prescription in THR. Short: Continue to attend regularly Long: Continue to follow program prescription. Short: Start to add in more exercise at home. Long: Continue to improve stamina.            Discharge Exercise Prescription (Final Exercise Prescription Changes):  Exercise Prescription Changes - 03/28/20 0800      Response to Exercise   Blood Pressure (Admit) 128/74    Blood Pressure (Exercise) 140/74    Blood Pressure (Exit) 106/64    Heart Rate (Admit) 68 bpm    Heart Rate (Exercise) 104 bpm    Heart Rate (Exit) 73 bpm    Rating of Perceived Exertion (Exercise) 13    Symptoms none    Duration Continue with 30 min of aerobic exercise without signs/symptoms of physical distress.    Intensity THRR unchanged      Progression   Progression Continue to progress workloads to maintain intensity without signs/symptoms of physical distress.    Average METs 2.08      Resistance Training   Training Prescription Yes    Weight 3 lb    Reps 10-15      Interval Training   Interval Training No      Treadmill   MPH 1.2    Grade 0.5    Minutes 15    METs 2      Recumbant Bike   Level 1    Watts 16    Minutes 15    METs 2.15      NuStep   Level 3    Minutes 15    METs  2.6      REL-XR   Level 2    Minutes 15    METs 1.2      Home Exercise Plan   Plans to continue exercise at Home (comment)   walking and staff videos   Frequency Add 2 additional days to program exercise sessions.    Initial Home Exercises Provided 03/27/20           Nutrition:  Target Goals: Understanding of nutrition guidelines, daily intake of sodium <152m, cholesterol <2070m calories 30% from fat and 7% or less from saturated fats, daily to have 5 or more servings of fruits and vegetables.  Education:  Controlling Sodium/Reading Food Labels -Group verbal and written material supporting the discussion of sodium use in heart healthy nutrition. Review and explanation with models, verbal and written materials for utilization of the food label.   Education: General Nutrition Guidelines/Fats and Fiber: -Group instruction provided by verbal, written material, models and posters to present the general guidelines for heart healthy nutrition. Gives an explanation and review of dietary fats and fiber.   Biometrics:  Pre Biometrics - 03/04/20 1517      Pre Biometrics   Height 5' 11.5" (1.816 m)    Weight 197 lb 9.6 oz (89.6 kg)    BMI (Calculated) 27.18    Single Leg Stand 7.75 seconds            Nutrition Therapy Plan and Nutrition Goals:  Nutrition Therapy & Goals - 03/31/20 1604      Nutrition Therapy   Diet heart healthy, low Na, diabetes friendly    Protein (specify units) 70-75g    Fiber 30 grams    Whole Grain Foods 3 servings    Saturated Fats 12 max. grams    Fruits and Vegetables 5 servings/day    Sodium 1.5 grams      Personal Nutrition Goals   Nutrition Goal ST:LT: no goals at this time    Comments A1C: 6. grape nuts, sandwich for lunch [place] or chicken salad, wife fixes dinner - mostly protein (bologna, hamburger, etc.). Discussed heart healthy eating and diabetes friendly eating. Will f/u and include wife for re-evaluation.      Intervention Plan   Intervention Prescribe, educate and counsel regarding individualized specific dietary modifications aiming towards targeted core components such as weight, hypertension, lipid management, diabetes, heart failure and other comorbidities.;Nutrition handout(s) given to patient.    Expected Outcomes Short Term Goal: Understand basic principles of dietary content, such as calories, fat, sodium, cholesterol and nutrients.;Short Term Goal: A plan has been developed with personal nutrition goals set during dietitian appointment.;Long  Term Goal: Adherence to prescribed nutrition plan.           Nutrition Assessments:  Nutrition Assessments - 03/05/20 0919      MEDFICTS Scores   Pre Score 86           MEDIFICTS Score Key:          ?70 Need to make dietary changes          40-70 Heart Healthy Diet         ? 40 Therapeutic Level Cholesterol Diet  Nutrition Goals Re-Evaluation:   Nutrition Goals Discharge (Final Nutrition Goals Re-Evaluation):   Psychosocial: Target Goals: Acknowledge presence or absence of significant depression and/or stress, maximize coping skills, provide positive support system. Participant is able to verbalize types and ability to use techniques and skills needed for reducing stress and depression.   Education: Depression -  Provides group verbal and written instruction on the correlation between heart/lung disease and depressed mood, treatment options, and the stigmas associated with seeking treatment.   Education: Sleep Hygiene -Provides group verbal and written instruction about how sleep can affect your health.  Define sleep hygiene, discuss sleep cycles and impact of sleep habits. Review good sleep hygiene tips.     Education: Stress and Anxiety: - Provides group verbal and written instruction about the health risks of elevated stress and causes of high stress.  Discuss the correlation between heart/lung disease and anxiety and treatment options. Review healthy ways to manage with stress and anxiety.    Initial Review & Psychosocial Screening:  Initial Psych Review & Screening - 03/03/20 1406      Initial Review   Current issues with History of Depression;Current Psychotropic Meds;Current Anxiety/Panic;Current Depression;Current Stress Concerns    Source of Stress Concerns Chronic Illness    Comments more depression then anxiety and currently well managed on meds with Dr. Sabra Heck, overall doing well      [place]? Yes   wife, daughter (lives  within a few miles)     Barriers   Psychosocial barriers to participate in program The patient should benefit from training in stress management and relaxation.;Psychosocial barriers identified (see note)      Screening Interventions   Interventions Encouraged to exercise;Provide feedback about the scores to participant;To provide support and resources with identified psychosocial needs    Expected Outcomes Short Term goal: Utilizing psychosocial counselor, staff and physician to assist with identification of specific Stressors or current issues interfering with healing process. Setting desired goal for each stressor or current issue identified.;Long Term Goal: Stressors or current issues are controlled or eliminated.;Short Term goal: Identification and review with participant of any Quality of Life or Depression concerns found by scoring the questionnaire.;Long Term goal: The participant improves quality of Life and PHQ9 Scores as seen by post scores and/or verbalization of changes           Quality of Life Scores:   Quality of Life - 03/04/20 1518      Quality of Life   Select Quality of Life      Quality of Life Scores   Health/Function Pre 20.2 %    Socioeconomic Pre 21.07 %    Psych/Spiritual Pre 24.43 %    Family Pre 22.8 %    GLOBAL Pre 21.63 %          Scores of 19 and below usually indicate a poorer quality of life in these areas.  A difference of  2-3 points is a clinically meaningful difference.  A difference of 2-3 points in the total score of the Quality of Life Index has been associated with significant improvement in overall quality of life, self-image, physical symptoms, and general health in studies assessing change in quality of life.  PHQ-9: Recent Review Flowsheet Data    Depression screen [facility] 2/9 03/04/2020 11/20/2018   Decreased Interest 0 0   Down, Depressed, Hopeless 0 0   PHQ - 2 Score 0 0   Altered sleeping 0 -   Tired, decreased energy 2 -   Change in  appetite 0 -   Feeling bad or failure about yourself  0 -   Trouble concentrating 0 -   Moving slowly or fidgety/restless 0 -   Suicidal thoughts 0 -   PHQ-9 Score 2 -     Interpretation of Total Score  Total  Score Depression Severity:  1-4 = Minimal depression, 5-9 = Mild depression, 10-14 = Moderate depression, 15-19 = Moderately severe depression, 20-27 = Severe depression   Psychosocial Evaluation and Intervention:  Psychosocial Evaluation - 03/03/20 1413      Psychosocial Evaluation & Interventions   Interventions Stress management education;Encouraged to exercise with the program and follow exercise prescription    Comments Octavia Bruckner is 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  He has a great support system in his wife and daughter (who lives near by).  He really wants to try to avoid any of this happening agin. He has never really exercised consistently before and is curious as to how it will go for him.  He wants to make sure he does not have any drawbacks.  He does have a history of depression with a little bit of anxiety but is currently well managed on plaxil.  Exercise should also help give him a mental boost.    Expected Outcomes Short: Attend rehab for mood boost with exercise. Long: Get into exercise routine.    Continue Psychosocial Services  Follow up required by staff           Psychosocial Re-Evaluation:  Psychosocial Re-Evaluation    [place] Name 03/27/20 1555             Psychosocial Re-Evaluation   Current issues with Current Stress Concerns       Comments Octavia Bruckner is doing well in rehab.  He is doing well mentally as well and denies current symptoms of depression and anxiety.  He is sleeping good most nights. Overall things are going well.       Expected Outcomes Short: Continue to attend rehab for strength  Long: Continue to stay positive.       Interventions Encouraged to attend Cardiac Rehabilitation for the exercise       Continue Psychosocial  Services  Follow up required by staff              Psychosocial Discharge (Final Psychosocial Re-Evaluation):  Psychosocial Re-Evaluation - 03/27/20 1555      Psychosocial Re-Evaluation   Current issues with Current Stress Concerns    Comments Octavia Bruckner is doing well in rehab.  He is doing well mentally as well and denies current symptoms of depression and anxiety.  He is sleeping good most nights. Overall things are going well.    Expected Outcomes Short: Continue to attend rehab for strength  Long: Continue to stay positive.    Interventions Encouraged to attend Cardiac Rehabilitation for the exercise    Continue Psychosocial Services  Follow up required by staff           Vocational Rehabilitation: Provide vocational rehab assistance to qualifying candidates.   Vocational Rehab Evaluation & Intervention:  Vocational Rehab - 03/03/20 1406      Initial Vocational Rehab Evaluation & Intervention   Assessment shows need for Vocational Rehabilitation No   retired          Education: Education Goals: Education classes will be provided on a variety of topics geared toward better understanding of heart health and risk factor modification. Participant will state understanding/return demonstration of topics presented as noted by education test scores.  Learning Barriers/Preferences:  Learning Barriers/Preferences - 03/03/20 1405      Learning Barriers/Preferences   Learning Barriers Sight   glasses   Learning Preferences Skilled Demonstration           General Cardiac Education Topics:  AED/CPR: - Group verbal and written instruction with the use of models to demonstrate the basic use of the AED with the basic ABC's of resuscitation.   Anatomy & Physiology of the Heart: - Group verbal and written instruction and models provide basic cardiac anatomy and physiology, with the coronary electrical and arterial systems. Review of Valvular disease and Heart Failure   Cardiac  Procedures: - Group verbal and written instruction to review commonly prescribed medications for heart disease. Reviews the medication, class of the drug, and side effects. Includes the steps to properly store meds and maintain the prescription regimen. (beta blockers and nitrates)   Cardiac Medications I: - Group verbal and written instruction to review commonly prescribed medications for heart disease. Reviews the medication, class of the drug, and side effects. Includes the steps to properly store meds and maintain the prescription regimen.   Cardiac Medications II: -Group verbal and written instruction to review commonly prescribed medications for heart disease. Reviews the medication, class of the drug, and side effects. (all other drug classes)    Go Sex-Intimacy & Heart Disease, Get SMART - Goal Setting: - Group verbal and written instruction through game format to discuss heart disease and the return to sexual intimacy. Provides group verbal and written material to discuss and apply goal setting through the application of the S.M.A.R.T. Method.   Other Matters of the Heart: - Provides group verbal, written materials and models to describe Stable Angina and Peripheral Artery. Includes description of the disease process and treatment options available to the cardiac patient.   Infection Prevention: - Provides verbal and written material to individual with discussion of infection control including proper hand washing and proper equipment cleaning during exercise session.   Cardiac Rehab from 03/04/2020 in [facility] Cardiac and Pulmonary Rehab  Date 03/04/20  Educator AS  Instruction Review Code 1- Verbalizes Understanding      Falls Prevention: - Provides verbal and written material to individual with discussion of falls prevention and safety.   Cardiac Rehab from 03/04/2020 in [facility] Cardiac and Pulmonary Rehab  Date 03/04/20  Educator AS  Instruction Review Code 1- Verbalizes  Understanding      Other: -Provides group and verbal instruction on various topics (see comments)   Knowledge Questionnaire Score:  Knowledge Questionnaire Score - 03/04/20 1518      Knowledge Questionnaire Score   Pre Score 22/26           Core Components/Risk Factors/Patient Goals at Admission:  Personal Goals and Risk Factors at Admission - 03/04/20 1522      Core Components/Risk Factors/Patient Goals on Admission    Weight Management Yes    Intervention Weight Management: Develop a combined nutrition and exercise program designed to reach desired caloric intake, while maintaining appropriate intake of nutrient and fiber, sodium and fats, and appropriate energy expenditure required for the weight goal.;Weight Management: Provide education and appropriate resources to help participant work on and attain dietary goals.    Admit Weight 197 lb 9.6 oz (89.6 kg)    Goal Weight: Short Term 195 lb (88.5 kg)    Goal Weight: Long Term 190 lb (86.2 kg)    Expected Outcomes Short Term: Continue to assess and modify interventions until short term weight is achieved;Weight Maintenance: Understanding of the daily nutrition guidelines, which includes 25-35% calories from fat, 7% or less cal from saturated fats, less than 286m cholesterol, less than 1.5gm of sodium, & 5 or more servings of fruits and vegetables daily  Diabetes Yes    Intervention Provide education about signs/symptoms and action to take for hypo/hyperglycemia.;Provide education about proper nutrition, including hydration, and aerobic/resistive exercise prescription along with prescribed medications to achieve blood glucose in normal ranges: Fasting glucose 65-99 mg/dL    Expected Outcomes Short Term: Participant verbalizes understanding of the signs/symptoms and immediate care of hyper/hypoglycemia, proper foot care and importance of medication, aerobic/resistive exercise and nutrition plan for blood glucose control.;Long Term:  Attainment of HbA1C < 7%.    Hypertension Yes    Intervention Provide education on lifestyle modifcations including regular physical activity/exercise, weight management, moderate sodium restriction and increased consumption of fresh fruit, vegetables, and low fat dairy, alcohol moderation, and smoking cessation.;Monitor prescription use compliance.    Expected Outcomes Short Term: Continued assessment and intervention until BP is < 140/25m HG in hypertensive participants. < 130/863mHG in hypertensive participants with diabetes, heart failure or chronic kidney disease.;Long Term: Maintenance of blood pressure at goal levels.    Lipids Yes    Intervention Provide education and support for participant on nutrition & aerobic/resistive exercise along with prescribed medications to achieve LDL <7093mHDL >50m44m  Expected Outcomes Short Term: Participant states understanding of desired cholesterol values and is compliant with medications prescribed. Participant is following exercise prescription and nutrition guidelines.;Long Term: Cholesterol controlled with medications as prescribed, with individualized exercise RX and with personalized nutrition plan. Value goals: LDL < 70mg43mL > 40 mg.           Education:Diabetes - Individual verbal and written instruction to review signs/symptoms of diabetes, desired ranges of glucose level fasting, after meals and with exercise. Acknowledge that pre and post exercise glucose checks will be done for 3 sessions at entry of program.   Education: Know Your Numbers and Risk Factors: -Group verbal and written instruction about important numbers in your health.  Discussion of what are risk factors and how they play a role in the disease process.  Review of Cholesterol, Blood Pressure, Diabetes, and BMI and the role they play in your overall health.   Core Components/Risk Factors/Patient Goals Review:   Goals and Risk Factor Review    Row Name 03/27/20 1556              Core Components/Risk Factors/Patient Goals Review   Personal Goals Review Weight Management/Obesity;Hypertension;Lipids       Review Tim iOctavia Bruckneroing well and his weight is staying steady. His blood pressures are doing well in class and at home.  He checks them routinely.  Overall, things are improving.       Expected Outcomes Short: Continue to work on weight loss Long; Continue to monitor risk factors.              Core Components/Risk Factors/Patient Goals at Discharge (Final Review):   Goals and Risk Factor Review - 03/27/20 1556      Core Components/Risk Factors/Patient Goals Review   Personal Goals Review Weight Management/Obesity;Hypertension;Lipids    Review Tim iOctavia Bruckneroing well and his weight is staying steady. His blood pressures are doing well in class and at home.  He checks them routinely.  Overall, things are improving.    Expected Outcomes Short: Continue to work on weight loss Long; Continue to monitor risk factors.           ITP Comments:  ITP Comments    Row Name 03/03/20 1425 03/04/20 1529 03/05/20 0847 03/05/20 1523 04/02/20 0611   ITP Comments Completed virtual orientation today.  EP evaluation is scheduled for Tuesday 5/18  at 2pm.  Documentation for diagnosis can be found in [facility] encounter 02/11/20. Completed 6MWT and gym orientation.  Initial ITP created and sent for review to Dr. Emily Filbert, Medical Director. 30 Day review completed. Medical Director review done, changes made as directed,and approval shown by signature of Market researcher.  New to program First full day of exercise!  Patient was oriented to gym and equipment including functions, settings, policies, and procedures.  Patient's individual exercise prescription and treatment plan were reviewed.  All starting workloads were established based on the results of the 6 minute walk test done at initial orientation visit.  The plan for exercise progression was also introduced and progression will be  customized based on patient's performance and goals. 30 Day review completed. Medical Director ITP review done, changes made as directed, and signed approval by Medical Director.          Comments: 30 Day review completed. Medical Director ITP review done, changes made as directed, and signed approval by Medical Director.

## 2020-04-03 ENCOUNTER — Other Ambulatory Visit: Payer: Self-pay

## 2020-04-03 ENCOUNTER — Encounter: Payer: Medicare Other | Admitting: *Deleted

## 2020-04-03 DIAGNOSIS — Z955 Presence of coronary angioplasty implant and graft: Secondary | ICD-10-CM | POA: Diagnosis not present

## 2020-04-03 NOTE — Progress Notes (Signed)
Daily Session Note  Patient Details  Name: Brent Alvarado MRN: [id number] Date of Birth: October 05, 1946 Referring Provider:     Cardiac Rehab from 03/04/2020 in [facility] Cardiac and Pulmonary Rehab  Referring Provider Neoma Laming      Encounter Date: 04/03/2020  Check In:  Session Check In - 04/03/20 1524      Check-In   Supervising physician immediately available to respond to emergencies See telemetry face sheet for immediately available ER MD    Location [facility]-Cardiac & Pulmonary Rehab    Staff Present Renita Papa, RN BSN;Joseph [address] [place], [place], [place], CCRP, Marylynn Pearson, [place] Exercise Physiologist    Virtual Visit No    Medication changes reported     No    Fall or balance concerns reported    No    Warm-up and Cool-down Performed on first and last piece of equipment    Resistance Training Performed Yes    VAD Patient? No    PAD/SET Patient? No      Pain Assessment   Currently in Pain? No/denies              Social History   Tobacco Use  Smoking Status Never Smoker  Smokeless Tobacco Never Used    Goals Met:  Independence with exercise equipment Exercise tolerated well No report of cardiac concerns or symptoms Strength training completed today  Goals Unmet:  Not Applicable  Comments: Pt able to follow exercise prescription today without complaint.  Will continue to monitor for progression.    Dr. Emily Filbert is Medical Director for [place] and [facility].

## 2020-04-07 ENCOUNTER — Encounter: Payer: Medicare Other | Admitting: *Deleted

## 2020-04-07 ENCOUNTER — Other Ambulatory Visit: Payer: Self-pay

## 2020-04-07 DIAGNOSIS — Z955 Presence of coronary angioplasty implant and graft: Secondary | ICD-10-CM

## 2020-04-07 NOTE — Progress Notes (Signed)
Daily Session Note  Patient Details  Name: Brent Alvarado MRN: [id number] Date of Birth: [birth year] Referring Provider:     Cardiac Rehab from 03/04/2020 in [facility] Cardiac and Pulmonary Rehab  Referring Provider Neoma Laming      Encounter Date: 04/07/2020  Check In:  Session Check In - 04/07/20 1523      Check-In   Supervising physician immediately available to respond to emergencies See telemetry face sheet for immediately available ER MD    Location [facility]-Cardiac & Pulmonary Rehab    Staff Present Renita Papa, RN BSN;Joseph [address] [place], [place], ACSM CEP, Exercise Physiologist;Amanda Oletta Darter, [place], ACSM CEP, Exercise Physiologist    Virtual Visit No    Medication changes reported     No    Fall or balance concerns reported    No    Warm-up and Cool-down Performed on first and last piece of equipment    Resistance Training Performed Yes    VAD Patient? No    PAD/SET Patient? No      Pain Assessment   Currently in Pain? No/denies              Social History   Tobacco Use  Smoking Status Never Smoker  Smokeless Tobacco Never Used    Goals Met:  Independence with exercise equipment Exercise tolerated well No report of cardiac concerns or symptoms Strength training completed today  Goals Unmet:  Not Applicable  Comments: Pt able to follow exercise prescription today without complaint.  Will continue to monitor for progression.    Dr. Emily Filbert is Medical Director for [place] and [facility].

## 2020-04-09 ENCOUNTER — Other Ambulatory Visit: Payer: Self-pay

## 2020-04-09 ENCOUNTER — Encounter: Payer: Medicare Other | Admitting: *Deleted

## 2020-04-09 DIAGNOSIS — Z955 Presence of coronary angioplasty implant and graft: Secondary | ICD-10-CM

## 2020-04-09 NOTE — Progress Notes (Signed)
Daily Session Note  Patient Details  Name: Brent Alvarado MRN: [id number] Date of Birth: 1945/11/09 Referring Provider:     Cardiac Rehab from 03/04/2020 in [facility] Cardiac and Pulmonary Rehab  Referring Provider Neoma Laming      Encounter Date: 04/09/2020  Check In:  Session Check In - 04/09/20 1530      Check-In   Supervising physician immediately available to respond to emergencies See telemetry face sheet for immediately available ER MD    Location [facility]-Cardiac & Pulmonary Rehab    Staff Present Renita Papa, RN BSN;Melissa Caiola RDN, Rowe Pavy, BA, ACSM CEP, Exercise Physiologist    Virtual Visit No    Medication changes reported     No    Fall or balance concerns reported    No    Warm-up and Cool-down Performed on first and last piece of equipment    Resistance Training Performed Yes    VAD Patient? No    PAD/SET Patient? No      Pain Assessment   Currently in Pain? No/denies              Social History   Tobacco Use  Smoking Status Never Smoker  Smokeless Tobacco Never Used    Goals Met:  Independence with exercise equipment Exercise tolerated well No report of cardiac concerns or symptoms Strength training completed today  Goals Unmet:  Not Applicable  Comments: Pt able to follow exercise prescription today without complaint.  Will continue to monitor for progression.    Dr. Emily Filbert is Medical Director for [place] and [facility].

## 2020-04-10 ENCOUNTER — Encounter: Payer: Medicare Other | Admitting: *Deleted

## 2020-04-10 ENCOUNTER — Other Ambulatory Visit: Payer: Self-pay

## 2020-04-10 DIAGNOSIS — Z955 Presence of coronary angioplasty implant and graft: Secondary | ICD-10-CM | POA: Diagnosis not present

## 2020-04-10 NOTE — Progress Notes (Signed)
Daily Session Note  Patient Details  Name: Brent Alvarado MRN: [id number] Date of Birth: 07/19/1946 Referring Provider:     Cardiac Rehab from 03/04/2020 in [facility] Cardiac and Pulmonary Rehab  Referring Provider Khan, shaukat      Encounter Date: 04/10/2020  Check In:  Session Check In - 04/10/20 1528      Check-In   Supervising physician immediately available to respond to emergencies See telemetry face sheet for immediately available ER MD    Location [facility]-Cardiac & Pulmonary Rehab    Staff Present Meredith Craven, RN BSN;Joseph Hood RCP,RRT,BSRT;Jessica Hawkins, MA, RCEP, CCRP, CCET;Amanda Sommer, BA, ACSM CEP, Exercise Physiologist    Virtual Visit No    Medication changes reported     No    Fall or balance concerns reported    No    Warm-up and Cool-down Performed on first and last piece of equipment    Resistance Training Performed Yes    VAD Patient? No    PAD/SET Patient? No      Pain Assessment   Currently in Pain? No/denies              Social History   Tobacco Use  Smoking Status Never Smoker  Smokeless Tobacco Never Used    Goals Met:  Independence with exercise equipment Exercise tolerated well No report of cardiac concerns or symptoms Strength training completed today  Goals Unmet:  Not Applicable  Comments: Pt able to follow exercise prescription today without complaint.  Will continue to monitor for progression.    Dr. Mark Miller is Medical Director for [facility] and [facility]. 

## 2020-04-14 ENCOUNTER — Other Ambulatory Visit: Payer: Self-pay

## 2020-04-14 ENCOUNTER — Encounter: Payer: Medicare Other | Admitting: *Deleted

## 2020-04-14 DIAGNOSIS — Z955 Presence of coronary angioplasty implant and graft: Secondary | ICD-10-CM

## 2020-04-14 NOTE — Progress Notes (Signed)
Daily Session Note  Patient Details  Name: Brent Alvarado MRN: [id number] Date of Birth: 06-08-1946 Referring Provider:     Cardiac Rehab from 03/04/2020 in [facility] Cardiac and Pulmonary Rehab  Referring Provider Neoma Laming      Encounter Date: 04/14/2020  Check In:  Session Check In - 04/14/20 1538      Check-In   Supervising physician immediately available to respond to emergencies See telemetry face sheet for immediately available ER MD    Location [facility]-Cardiac & Pulmonary Rehab    Staff Present Justin Mend RCP,RRT,BSRT;Maanvi Lecompte Sherryll Burger, RN Vickki Hearing, BA, ACSM CEP, Exercise Physiologist;Jessica Luan Pulling, MA, RCEP, CCRP, CCET    Virtual Visit No    Medication changes reported     No    Fall or balance concerns reported    No    Warm-up and Cool-down Performed on first and last piece of equipment    Resistance Training Performed Yes    VAD Patient? No    PAD/SET Patient? No      Pain Assessment   Currently in Pain? No/denies              Social History   Tobacco Use  Smoking Status Never Smoker  Smokeless Tobacco Never Used    Goals Met:  Independence with exercise equipment Exercise tolerated well No report of cardiac concerns or symptoms Strength training completed today  Goals Unmet:  Not Applicable  Comments: Pt able to follow exercise prescription today without complaint.  Will continue to monitor for progression.    Dr. Emily Filbert is Medical Director for [place] and [facility].

## 2020-04-16 ENCOUNTER — Encounter: Payer: Medicare Other | Admitting: *Deleted

## 2020-04-16 ENCOUNTER — Other Ambulatory Visit: Payer: Self-pay

## 2020-04-16 DIAGNOSIS — Z955 Presence of coronary angioplasty implant and graft: Secondary | ICD-10-CM | POA: Diagnosis not present

## 2020-04-16 NOTE — Progress Notes (Signed)
Daily Session Note  Patient Details  Name: Brent Alvarado MRN: [id number] Date of Birth: 09/16/1946 Referring Provider:     Cardiac Rehab from 03/04/2020 in [facility] Cardiac and Pulmonary Rehab  Referring Provider Neoma Laming      Encounter Date: 04/16/2020  Check In:  Session Check In - 04/16/20 1541      Check-In   Supervising physician immediately available to respond to emergencies See telemetry face sheet for immediately available ER MD    Location [facility]-Cardiac & Pulmonary Rehab    Staff Present Renita Papa, RN BSN;Laureen Owens Shark, BS, RRT, CPFT;Amanda Oletta Darter, BA, ACSM CEP, Exercise Physiologist    Virtual Visit No    Medication changes reported     No    Fall or balance concerns reported    No    Warm-up and Cool-down Performed on first and last piece of equipment    Resistance Training Performed Yes    VAD Patient? No    PAD/SET Patient? No      Pain Assessment   Currently in Pain? No/denies              Social History   Tobacco Use  Smoking Status Never Smoker  Smokeless Tobacco Never Used    Goals Met:  Independence with exercise equipment Exercise tolerated well No report of cardiac concerns or symptoms Strength training completed today  Goals Unmet:  Not Applicable  Comments: Pt able to follow exercise prescription today without complaint.  Will continue to monitor for progression.    Dr. Emily Filbert is Medical Director for [place] and [facility].

## 2020-04-17 ENCOUNTER — Other Ambulatory Visit: Payer: Self-pay

## 2020-04-17 ENCOUNTER — Encounter: Payer: Medicare Other | Attending: Cardiovascular Disease | Admitting: *Deleted

## 2020-04-17 DIAGNOSIS — Z955 Presence of coronary angioplasty implant and graft: Secondary | ICD-10-CM | POA: Insufficient documentation

## 2020-04-17 DIAGNOSIS — Z79899 Other long term (current) drug therapy: Secondary | ICD-10-CM | POA: Diagnosis not present

## 2020-04-17 NOTE — Progress Notes (Signed)
Daily Session Note  Patient Details  Name: Brent Alvarado MRN: [id number] Date of Birth: [birth year] Referring Provider:     Cardiac Rehab from 03/04/2020 in [facility] Cardiac and Pulmonary Rehab  Referring Provider Neoma Laming      Encounter Date: 04/17/2020  Check In:  Session Check In - 04/17/20 1537      Check-In   Supervising physician immediately available to respond to emergencies See telemetry face sheet for immediately available ER MD    Location [facility]-Cardiac & Pulmonary Rehab    Staff Present Renita Papa, RN BSN;Joseph [address] [place], [place], [place], CCRP, CCET    Virtual Visit No    Medication changes reported     No    Fall or balance concerns reported    No    Warm-up and Cool-down Performed on first and last piece of equipment    Resistance Training Performed Yes    VAD Patient? No    PAD/SET Patient? No      Pain Assessment   Currently in Pain? No/denies              Social History   Tobacco Use  Smoking Status Never Smoker  Smokeless Tobacco Never Used    Goals Met:  Independence with exercise equipment Exercise tolerated well No report of cardiac concerns or symptoms Strength training completed today  Goals Unmet:  Not Applicable  Comments: Pt able to follow exercise prescription today without complaint.  Will continue to monitor for progression.    Dr. Emily Filbert is Medical Director for [place] and [facility].

## 2020-04-24 ENCOUNTER — Other Ambulatory Visit: Payer: Self-pay

## 2020-04-24 ENCOUNTER — Encounter: Payer: Medicare Other | Admitting: *Deleted

## 2020-04-24 DIAGNOSIS — Z955 Presence of coronary angioplasty implant and graft: Secondary | ICD-10-CM | POA: Diagnosis not present

## 2020-04-24 NOTE — Progress Notes (Signed)
Daily Session Note  Patient Details  Name: Brent Alvarado MRN: [id number] Date of Birth: 1946-01-05 Referring Provider:     Cardiac Rehab from 03/04/2020 in [facility] Cardiac and Pulmonary Rehab  Referring Provider Neoma Laming      Encounter Date: 04/24/2020  Check In:  Session Check In - 04/24/20 1601      Check-In   Supervising physician immediately available to respond to emergencies See telemetry face sheet for immediately available ER MD    Location [facility]-Cardiac & Pulmonary Rehab    Staff Present Renita Papa, RN BSN;Joseph [address] [place], [place], [place], CCRP, [place], [place], ACSM CEP, Exercise Physiologist    Virtual Visit No    Medication changes reported     No    Fall or balance concerns reported    No    Warm-up and Cool-down Performed on first and last piece of equipment    Resistance Training Performed Yes    VAD Patient? No    PAD/SET Patient? No      Pain Assessment   Currently in Pain? No/denies              Social History   Tobacco Use  Smoking Status Never Smoker  Smokeless Tobacco Never Used    Goals Met:  Independence with exercise equipment Exercise tolerated well No report of cardiac concerns or symptoms Strength training completed today  Goals Unmet:  Not Applicable  Comments: Pt able to follow exercise prescription today without complaint.  Will continue to monitor for progression.    Dr. Emily Filbert is Medical Director for [place] and [facility].

## 2020-04-28 ENCOUNTER — Encounter: Payer: Medicare Other | Admitting: *Deleted

## 2020-04-28 ENCOUNTER — Other Ambulatory Visit: Payer: Self-pay

## 2020-04-28 DIAGNOSIS — Z955 Presence of coronary angioplasty implant and graft: Secondary | ICD-10-CM | POA: Diagnosis not present

## 2020-04-28 NOTE — Progress Notes (Signed)
Daily Session Note  Patient Details  Name: Brent Alvarado MRN: [id number] Date of Birth: 1945/11/28 Referring Provider:     Cardiac Rehab from 03/04/2020 in [facility] Cardiac and Pulmonary Rehab  Referring Provider Neoma Laming      Encounter Date: 04/28/2020  Check In:  Session Check In - 04/28/20 1607      Check-In   Supervising physician immediately available to respond to emergencies See telemetry face sheet for immediately available ER MD    Location [facility]-Cardiac & Pulmonary Rehab    Staff Present Renita Papa, RN Margurite Auerbach, MS Exercise Physiologist;Kelly Amedeo Plenty, BS, ACSM CEP, Exercise Physiologist    Virtual Visit No    Medication changes reported     No    Fall or balance concerns reported    No    Warm-up and Cool-down Performed on first and last piece of equipment    Resistance Training Performed Yes    VAD Patient? No    PAD/SET Patient? No      Pain Assessment   Currently in Pain? No/denies              Social History   Tobacco Use  Smoking Status Never Smoker  Smokeless Tobacco Never Used    Goals Met:  Independence with exercise equipment Exercise tolerated well No report of cardiac concerns or symptoms Strength training completed today  Goals Unmet:  Not Applicable  Comments: Pt able to follow exercise prescription today without complaint.  Will continue to monitor for progression.    Dr. Emily Filbert is Medical Director for [place] and [facility].

## 2020-04-30 ENCOUNTER — Encounter: Payer: Medicare Other | Admitting: *Deleted

## 2020-04-30 ENCOUNTER — Other Ambulatory Visit: Payer: Self-pay

## 2020-04-30 ENCOUNTER — Encounter: Payer: Self-pay | Admitting: *Deleted

## 2020-04-30 DIAGNOSIS — Z955 Presence of coronary angioplasty implant and graft: Secondary | ICD-10-CM

## 2020-04-30 NOTE — Progress Notes (Signed)
Cardiac Individual Treatment Plan  Patient Details  Name: Brent Alvarado MRN: [id number] Date of Birth: Jun 22, 1946 Referring Provider:     Cardiac Rehab from 03/04/2020 in [facility] Cardiac and Pulmonary Rehab  Referring Provider Humphrey Rolls, [place]      Initial Encounter Date:    Cardiac Rehab from 03/04/2020 in [facility] Cardiac and Pulmonary Rehab  Date 03/04/20      Visit Diagnosis: Status post coronary artery stent placement  Patient's Home Medications on Admission:  Current Outpatient Medications:  .  amLODipine (NORVASC) 10 MG tablet, Take 10 mg by mouth daily., Disp: , Rfl:  .  aspirin 81 MG chewable tablet, Chew 1 tablet (81 mg total) by mouth daily. (Patient not taking: Reported on 03/03/2020), Disp: 30 tablet, Rfl: 0 .  baclofen (LIORESAL) 10 MG tablet, Take 10 mg by mouth 3 (three) times daily., Disp: , Rfl:  .  clopidogrel (PLAVIX) 75 MG tablet, Take 1 tablet (75 mg total) by mouth daily with breakfast., Disp: 30 tablet, Rfl: 0 .  Dulaglutide (TRULICITY) 2.68 TM/1.9QQ SOPN, Inject into the skin once a week., Disp: , Rfl:  .  fenofibrate 54 MG tablet, Take 54 mg by mouth daily., Disp: , Rfl:  .  furosemide (LASIX) 20 MG tablet, Take 20 mg by mouth daily. , Disp: , Rfl:  .  glimepiride (AMARYL) 4 MG tablet, Take 1 tablet by mouth daily., Disp: , Rfl:  .  losartan (COZAAR) 50 MG tablet, Take 50 mg by mouth every morning., Disp: , Rfl:  .  nitroGLYCERIN (NITROSTAT) 0.4 MG SL tablet, Place 1 tablet (0.4 mg total) under the tongue every 5 (five) minutes x 3 doses as needed for chest pain., Disp: 20 tablet, Rfl: 1 .  ondansetron (ZOFRAN) 4 MG tablet, TAKE 1 TABLET BY MOUTH 3 TIMES DAILY AS NEEDED FOR NAUSEA FOR UP TO 7 DAYS, Disp: , Rfl:  .  pantoprazole (PROTONIX) 40 MG tablet, Take 1 tablet by mouth 2 (two) times daily., Disp: , Rfl:  .  PARoxetine (PAXIL) 40 MG tablet, Take 40 mg by mouth every morning., Disp: , Rfl:  .  polyethylene glycol (MIRALAX / GLYCOLAX) 17 g packet, Take 17 g by  mouth daily., Disp: , Rfl:  .  rosuvastatin (CRESTOR) 40 MG tablet, Take 1 tablet by mouth daily., Disp: , Rfl:  .  sotalol (BETAPACE) 80 MG tablet, Take 80 mg by mouth 2 (two) times daily., Disp: , Rfl:  .  tamsulosin (FLOMAX) 0.4 MG CAPS capsule, Take 0.4 mg by mouth., Disp: , Rfl:  .  XARELTO 15 MG TABS tablet, Take 15 mg by mouth daily., Disp: , Rfl:  No current facility-administered medications for this visit.  Facility-Administered Medications Ordered in Other Visits:  .  0.9 %  sodium chloride infusion, 250 mL, Intravenous, PRN, Neoma Laming A, MD .  sodium chloride flush (NS) 0.9 % injection 3 mL, 3 mL, Intravenous, Q12H, Neoma Laming A, MD .  sodium chloride flush (NS) 0.9 % injection 3 mL, 3 mL, Intravenous, PRN, Neoma Laming A, MD .  sodium chloride flush (NS) 0.9 % injection 3 mL, 3 mL, Intravenous, Q12H, Dionisio David, MD  Past Medical History: Past Medical History:  Diagnosis Date  . Anxiety   . BPH (benign prostatic hyperplasia)   . Depression   . Diabetes mellitus without complication ([place])    Controlled poorly with metformin  . Diverticulitis   . Dyspnea   . Dysrhythmia   . GERD (gastroesophageal reflux disease)   .  Hyperlipidemia   . Hypertension     Tobacco Use: Social History   Tobacco Use  Smoking Status Never Smoker  Smokeless Tobacco Never Used    Labs: Recent Review Flowsheet Data    Labs for ITP Cardiac and Pulmonary Rehab Latest Ref Rng & Units 02/11/2020   Hemoglobin A1c 4.8 - 5.6 % 6.5(H)       Exercise Target Goals: Exercise Program Goal: Individual exercise prescription set using results from initial 6 min walk test and THRR while considering  patient's activity barriers and safety.   Exercise Prescription Goal: Initial exercise prescription builds to 30-45 minutes a day of aerobic activity, 2-3 days per week.  Home exercise guidelines will be given to patient during program as part of exercise prescription that the participant will  acknowledge.   Education: Aerobic Exercise & Resistance Training: - Gives group verbal and written instruction on the various components of exercise. Focuses on aerobic and resistive training programs and the benefits of this training and how to safely progress through these programs..   Education: Exercise & Equipment Safety: - Individual verbal instruction and demonstration of equipment use and safety with use of the equipment.   Cardiac Rehab from 03/04/2020 in [facility] Cardiac and Pulmonary Rehab  Date 03/04/20  Educator AS  Instruction Review Code 1- Verbalizes Understanding      Education: Exercise Physiology & General Exercise Guidelines: - Group verbal and written instruction with models to review the exercise physiology of the cardiovascular system and associated critical values. Provides general exercise guidelines with specific guidelines to those with heart or lung disease.    Education: Flexibility, Balance, Mind/Body Relaxation: Provides group verbal/written instruction on the benefits of flexibility and balance training, including mind/body exercise modes such as yoga, pilates and tai chi.  Demonstration and skill practice provided.   Activity Barriers & Risk Stratification:  Activity Barriers & Cardiac Risk Stratification - 03/03/20 1405      Activity Barriers & Cardiac Risk Stratification   Activity Barriers Back Problems;Neck/Spine Problems;Arthritis;Deconditioning;Muscular Weakness;Shortness of Breath   chronic low back pain   Cardiac Risk Stratification Moderate           6 Minute Walk:  6 Minute Walk    Row Name 03/04/20 1512         6 Minute Walk   Phase Initial     Distance 1098 feet     Walk Time 6 minutes     # of Rest Breaks 0     MPH 2.08     METS 2.13     RPE 13     Perceived Dyspnea  2     VO2 Peak 7.45     Symptoms Yes (comment)     Comments hip pain ( been for 30 years)     Resting HR 65 bpm     Resting BP 108/64     Resting Oxygen  Saturation  97 %     Exercise Oxygen Saturation  during 6 min walk 98 %     Max Ex. HR 90 bpm     Max Ex. BP 110/54     2 Minute Post BP 110/64            Oxygen Initial Assessment:   Oxygen Re-Evaluation:   Oxygen Discharge (Final Oxygen Re-Evaluation):   Initial Exercise Prescription:  Initial Exercise Prescription - 03/04/20 1500      Date of Initial Exercise RX and Referring Provider   Date 03/04/20    Referring  Provider Humphrey Rolls, sh[place]      Treadmill   MPH 1.5    Grade 0    Minutes 15    METs 2.15      Recumbant Bike   Level 1    RPM 60    Minutes 15    METs 2.13      NuStep   Level 2    SPM 80    Minutes 15    METs 2.13      REL-XR   Level 2    Speed 50    Minutes 15    METs 2.13      Prescription Details   Frequency (times per week) 3    Duration Progress to 30 minutes of continuous aerobic without signs/symptoms of physical distress      Intensity   THRR 40-80% of Max Heartrate 97-129    Ratings of Perceived Exertion 11-15    Perceived Dyspnea 0-4      Resistance Training   Training Prescription Yes    Weight 3 lb    Reps 10-15           Perform Capillary Blood Glucose checks as needed.  Exercise Prescription Changes:  Exercise Prescription Changes    Row Name 03/04/20 1500 03/13/20 1500 03/28/20 0800 04/07/20 1600 04/22/20 1400     Response to Exercise   Blood Pressure (Admit) 108/64 116/60 128/74 122/64 122/64   Blood Pressure (Exercise) 110/54 120/64 140/74 120/60 124/68   Blood Pressure (Exit) 110/64 100/50 106/64 102/60 118/68   Heart Rate (Admit) 65 bpm 61 bpm 68 bpm 65 bpm 65 bpm   Heart Rate (Exercise) 90 bpm 86 bpm 104 bpm 73 bpm 99 bpm   Heart Rate (Exit) 75 bpm 77 bpm 73 bpm 69 bpm 77 bpm   Oxygen Saturation (Admit) 97 % -- -- -- --   Oxygen Saturation (Exercise) 98 % -- -- -- --   Rating of Perceived Exertion (Exercise) 13 13 13 12 13    Perceived Dyspnea (Exercise) 2 -- -- -- --   Symptoms hip pain none none none  none   Duration -- Progress to 30 minutes of  aerobic without signs/symptoms of physical distress Continue with 30 min of aerobic exercise without signs/symptoms of physical distress. Continue with 30 min of aerobic exercise without signs/symptoms of physical distress. Continue with 30 min of aerobic exercise without signs/symptoms of physical distress.   Intensity -- THRR unchanged THRR unchanged THRR unchanged THRR unchanged     Progression   Progression -- Continue to progress workloads to maintain intensity without signs/symptoms of physical distress. Continue to progress workloads to maintain intensity without signs/symptoms of physical distress. Continue to progress workloads to maintain intensity without signs/symptoms of physical distress. Continue to progress workloads to maintain intensity without signs/symptoms of physical distress.   Average METs -- 2.35 2.08 2.29 2.27     Resistance Training   Training Prescription -- Yes Yes Yes Yes   Weight -- 3 lb 3 lb 3 lb 3 lb   Reps -- 10-15 10-15 10-15 10-15     Interval Training   Interval Training -- No No No No     Treadmill   MPH -- 1.3 1.2 1.5 1.7   Grade -- 0 0.5 0 0   Minutes -- 15 15 15 15    METs -- 2 2 2.15 2.3     Recumbant Bike   Level -- -- 1 3 --   Watts -- -- 16 16 --  Minutes -- -- 15 15 --   METs -- -- 2.15 2.15 --     NuStep   Level -- 2 3 3 3    Minutes -- 15 15 15 15    METs -- 2.7 2.6 2.2 2     REL-XR   Level -- 2 2 2 3    Minutes -- 15 15 15 15    METs -- -- 1.2 -- --     Home Exercise Plan   Plans to continue exercise at -- -- Home (comment)  walking and staff videos Home (comment)  walking and staff videos Home (comment)  walking and staff videos   Frequency -- -- Add 2 additional days to program exercise sessions. Add 2 additional days to program exercise sessions. Add 2 additional days to program exercise sessions.   Initial Home Exercises Provided -- -- 03/27/20 03/27/20 03/27/20           Exercise Comments:   Exercise Goals and Review:  Exercise Goals    Row Name 03/04/20 1516             Exercise Goals   Increase Physical Activity Yes       Intervention Provide advice, education, support and counseling about physical activity/exercise needs.;Develop an individualized exercise prescription for aerobic and resistive training based on initial evaluation findings, risk stratification, comorbidities and participant's personal goals.       Expected Outcomes Short Term: Attend rehab on a regular basis to increase amount of physical activity.;Long Term: Add in home exercise to make exercise part of routine and to increase amount of physical activity.;Long Term: Exercising regularly at least 3-5 days a week.       Increase Strength and Stamina Yes       Intervention Provide advice, education, support and counseling about physical activity/exercise needs.;Develop an individualized exercise prescription for aerobic and resistive training based on initial evaluation findings, risk stratification, comorbidities and participant's personal goals.       Expected Outcomes Short Term: Increase workloads from initial exercise prescription for resistance, speed, and METs.;Short Term: Perform resistance training exercises routinely during rehab and add in resistance training at home;Long Term: Improve cardiorespiratory fitness, muscular endurance and strength as measured by increased METs and functional capacity (6MWT)       Able to understand and use rate of perceived exertion (RPE) scale Yes       Intervention Provide education and explanation on how to use RPE scale       Expected Outcomes Short Term: Able to use RPE daily in rehab to express subjective intensity level;Long Term:  Able to use RPE to guide intensity level when exercising independently       Able to understand and use Dyspnea scale Yes       Intervention Provide education and explanation on how to use Dyspnea scale        Expected Outcomes Short Term: Able to use Dyspnea scale daily in rehab to express subjective sense of shortness of breath during exertion;Long Term: Able to use Dyspnea scale to guide intensity level when exercising independently       Knowledge and understanding of Target Heart Rate Range (THRR) Yes       Intervention Provide education and explanation of THRR including how the numbers were predicted and where they are located for reference       Expected Outcomes Short Term: Able to state/look up THRR;Short Term: Able to use daily as guideline for intensity in rehab;Long Term: Able to  use THRR to govern intensity when exercising independently       Able to check pulse independently Yes       Intervention Provide education and demonstration on how to check pulse in carotid and radial arteries.;Review the importance of being able to check your own pulse for safety during independent exercise       Expected Outcomes Short Term: Able to explain why pulse checking is important during independent exercise;Long Term: Able to check pulse independently and accurately       Understanding of Exercise Prescription Yes       Intervention Provide education, explanation, and written materials on patient's individual exercise prescription       Expected Outcomes Short Term: Able to explain program exercise prescription;Long Term: Able to explain home exercise prescription to exercise independently              Exercise Goals Re-Evaluation :  Exercise Goals Re-Evaluation    Row Name 03/05/20 1523 03/13/20 1556 03/27/20 1553 04/07/20 1623 04/10/20 1607     Exercise Goal Re-Evaluation   Exercise Goals Review Increase Physical Activity;Knowledge and understanding of Target Heart Rate Range (THRR);Able to understand and use rate of perceived exertion (RPE) scale;Understanding of Exercise Prescription;Increase Strength and Stamina;Able to check pulse independently Increase Physical Activity;Increase Strength and  Stamina;Understanding of Exercise Prescription Increase Physical Activity;Increase Strength and Stamina;Understanding of Exercise Prescription Increase Physical Activity;Increase Strength and Stamina;Understanding of Exercise Prescription Increase Strength and Stamina;Increase Physical Activity   Comments Reviewed RPE and dyspnea scales, THR and program prescription with pt today.  Pt voiced understanding and was given a copy of goals to take home. Octavia Bruckner is off to a good start in rehab.  He has taken to the exercise very well with his frist four days of exercise.  We will continue to monitor her progress. Octavia Bruckner is doing well in rehab.  He is starting to notice that his strength and stamina are starting to recover.  He has also noticed that his breathing is getting better as it is easier to do things.  Reviewed home exercise with pt today.  Pt plans to walk and staff videos at home for exercise.  Reviewed THR, pulse, RPE, sign and symptoms, pulse oximetery and when to call 911 or MD.  Also discussed weather considerations and indoor options.  Pt voiced understanding. Octavia Bruckner continues to do well in rehab.  He is up to 16 watts on the bike.  We will continue to montior his progress. Patient states that he is not doing any exercise at home. He does not have the motivation to workout at home. Talked to patient why it is important to exercise at home. He states that he is able to monitor his heart rate at home. He is onboard with trying to walk a few days a week outside of rehab.   Expected Outcomes Short: Use RPE daily to regulate intensity. Long: Follow program prescription in THR. Short: Continue to attend regularly Long: Continue to follow program prescription. Short: Start to add in more exercise at home. Long: Continue to improve stamina. Short: Increase treadmill Long: Continue to improve stamina. Short: add two days of walking at home. Long: add a routine of home exercise daily after [place] Name 04/22/20  1424             Exercise Goal Re-Evaluation   Exercise Goals Review Increase Strength and Stamina;Increase Physical Activity;Understanding of Exercise Prescription       Comments Octavia Bruckner is  doing well, he has increased to a 1.7 speed on the treadmill. Will continue to focus on increasing workloads.       Expected Outcomes Short: Continue attending rehab regularly Long: Increase MET level and stamina/ endurance              Discharge Exercise Prescription (Final Exercise Prescription Changes):  Exercise Prescription Changes - 04/22/20 1400      Response to Exercise   Blood Pressure (Admit) 122/64    Blood Pressure (Exercise) 124/68    Blood Pressure (Exit) 118/68    Heart Rate (Admit) 65 bpm    Heart Rate (Exercise) 99 bpm    Heart Rate (Exit) 77 bpm    Rating of Perceived Exertion (Exercise) 13    Symptoms none    Duration Continue with 30 min of aerobic exercise without signs/symptoms of physical distress.    Intensity THRR unchanged      Progression   Progression Continue to progress workloads to maintain intensity without signs/symptoms of physical distress.    Average METs 2.27      Resistance Training   Training Prescription Yes    Weight 3 lb    Reps 10-15      Interval Training   Interval Training No      Treadmill   MPH 1.7    Grade 0    Minutes 15    METs 2.3      NuStep   Level 3    Minutes 15    METs 2      REL-XR   Level 3    Minutes 15      Home Exercise Plan   Plans to continue exercise at Home (comment)   walking and staff videos   Frequency Add 2 additional days to program exercise sessions.    Initial Home Exercises Provided 03/27/20           Nutrition:  Target Goals: Understanding of nutrition guidelines, daily intake of sodium <1511m, cholesterol <2064m calories 30% from fat and 7% or less from saturated fats, daily to have 5 or more servings of fruits and vegetables.  Education: Controlling Sodium/Reading Food Labels -Group  verbal and written material supporting the discussion of sodium use in heart healthy nutrition. Review and explanation with models, verbal and written materials for utilization of the food label.   Education: General Nutrition Guidelines/Fats and Fiber: -Group instruction provided by verbal, written material, models and posters to present the general guidelines for heart healthy nutrition. Gives an explanation and review of dietary fats and fiber.   Biometrics:  Pre Biometrics - 03/04/20 1517      Pre Biometrics   Height 5' 11.5" (1.816 m)    Weight 197 lb 9.6 oz (89.6 kg)    BMI (Calculated) 27.18    Single Leg Stand 7.75 seconds            Nutrition Therapy Plan and Nutrition Goals:  Nutrition Therapy & Goals - 03/31/20 1604      Nutrition Therapy   Diet heart healthy, low Na, diabetes friendly    Protein (specify units) 70-75g    Fiber 30 grams    Whole Grain Foods 3 servings    Saturated Fats 12 max. grams    Fruits and Vegetables 5 servings/day    Sodium 1.5 grams      Personal Nutrition Goals   Nutrition Goal ST:LT: no goals at this time    Comments A1C: 6. grape nuts, sandwich for lunch [place]  or chicken salad, wife fixes dinner - mostly protein (bologna, hamburger, etc.). Discussed heart healthy eating and diabetes friendly eating. Will f/u and include wife for re-evaluation.      Intervention Plan   Intervention Prescribe, educate and counsel regarding individualized specific dietary modifications aiming towards targeted core components such as weight, hypertension, lipid management, diabetes, heart failure and other comorbidities.;Nutrition handout(s) given to patient.    Expected Outcomes Short Term Goal: Understand basic principles of dietary content, such as calories, fat, sodium, cholesterol and nutrients.;Short Term Goal: A plan has been developed with personal nutrition goals set during dietitian appointment.;Long Term Goal: Adherence to prescribed nutrition  plan.           Nutrition Assessments:  Nutrition Assessments - 03/05/20 0919      MEDFICTS Scores   Pre Score 86           MEDIFICTS Score Key:          ?70 Need to make dietary changes          40-70 Heart Healthy Diet         ? 40 Therapeutic Level Cholesterol Diet  Nutrition Goals Re-Evaluation:   Nutrition Goals Discharge (Final Nutrition Goals Re-Evaluation):   Psychosocial: Target Goals: Acknowledge presence or absence of significant depression and/or stress, maximize coping skills, provide positive support system. Participant is able to verbalize types and ability to use techniques and skills needed for reducing stress and depression.   Education: Depression - Provides group verbal and written instruction on the correlation between heart/lung disease and depressed mood, treatment options, and the stigmas associated with seeking treatment.   Education: Sleep Hygiene -Provides group verbal and written instruction about how sleep can affect your health.  Define sleep hygiene, discuss sleep cycles and impact of sleep habits. Review good sleep hygiene tips.     Education: Stress and Anxiety: - Provides group verbal and written instruction about the health risks of elevated stress and causes of high stress.  Discuss the correlation between heart/lung disease and anxiety and treatment options. Review healthy ways to manage with stress and anxiety.    Initial Review & Psychosocial Screening:  Initial Psych Review & Screening - 03/03/20 1406      Initial Review   Current issues with History of Depression;Current Psychotropic Meds;Current Anxiety/Panic;Current Depression;Current Stress Concerns    Source of Stress Concerns Chronic Illness    Comments more depression then anxiety and currently well managed on meds with Dr. Sabra Heck, overall doing well      Rake? Yes   wife, daughter (lives within a few miles)     Barriers    Psychosocial barriers to participate in program The patient should benefit from training in stress management and relaxation.;Psychosocial barriers identified (see note)      Screening Interventions   Interventions Encouraged to exercise;Provide feedback about the scores to participant;To provide support and resources with identified psychosocial needs    Expected Outcomes Short Term goal: Utilizing psychosocial counselor, staff and physician to assist with identification of specific Stressors or current issues interfering with healing process. Setting desired goal for each stressor or current issue identified.;Long Term Goal: Stressors or current issues are controlled or eliminated.;Short Term goal: Identification and review with participant of any Quality of Life or Depression concerns found by scoring the questionnaire.;Long Term goal: The participant improves quality of Life and PHQ9 Scores as seen by post scores and/or verbalization of changes  Quality of Life Scores:   Quality of Life - 03/04/20 1518      Quality of Life   Select Quality of Life      Quality of Life Scores   Health/Function Pre 20.2 %    Socioeconomic Pre 21.07 %    Psych/Spiritual Pre 24.43 %    Family Pre 22.8 %    GLOBAL Pre 21.63 %          Scores of 19 and below usually indicate a poorer quality of life in these areas.  A difference of  2-3 points is a clinically meaningful difference.  A difference of 2-3 points in the total score of the Quality of Life Index has been associated with significant improvement in overall quality of life, self-image, physical symptoms, and general health in studies assessing change in quality of life.  PHQ-9: Recent Review Flowsheet Data    Depression screen [facility] 2/9 03/04/2020 11/20/2018   Decreased Interest 0 0   Down, Depressed, Hopeless 0 0   PHQ - 2 Score 0 0   Altered sleeping 0 -   Tired, decreased energy 2 -   Change in appetite 0 -   Feeling bad or failure  about yourself  0 -   Trouble concentrating 0 -   Moving slowly or fidgety/restless 0 -   Suicidal thoughts 0 -   PHQ-9 Score 2 -     Interpretation of Total Score  Total Score Depression Severity:  1-4 = Minimal depression, 5-9 = Mild depression, 10-14 = Moderate depression, 15-19 = Moderately severe depression, 20-27 = Severe depression   Psychosocial Evaluation and Intervention:  Psychosocial Evaluation - 03/03/20 1413      Psychosocial Evaluation & Interventions   Interventions Stress management education;Encouraged to exercise with the program and follow exercise prescription    Comments Octavia Bruckner is 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  He has a great support system in his wife and daughter (who lives near by).  He really wants to try to avoid any of this happening agin. He has never really exercised consistently before and is curious as to how it will go for him.  He wants to make sure he does not have any drawbacks.  He does have a history of depression with a little bit of anxiety but is currently well managed on plaxil.  Exercise should also help give him a mental boost.    Expected Outcomes Short: Attend rehab for mood boost with exercise. Long: Get into exercise routine.    Continue Psychosocial Services  Follow up required by staff           Psychosocial Re-Evaluation:  Psychosocial Re-Evaluation    [place] Name 03/27/20 1555 04/10/20 1604           Psychosocial Re-Evaluation   Current issues with Current Stress Concerns None Identified      Comments Octavia Bruckner is doing well in rehab.  He is doing well mentally as well and denies current symptoms of depression and anxiety.  He is sleeping good most nights. Overall things are going well. Patient reports no issues with their current mental states, sleep, stress, depression or anxiety. Will follow up with patient in a few weeks for any changes.      Expected Outcomes Short: Continue to attend rehab for strength   Long: Continue to stay positive. Short: Continue to exercise regularly to support mental health and notify staff of any changes. Long: maintain mental health  and well being through teaching of rehab or prescribed medications independently.      Interventions Encouraged to attend Cardiac Rehabilitation for the exercise Encouraged to attend Cardiac Rehabilitation for the exercise      Continue Psychosocial Services  Follow up required by staff Follow up required by staff             Psychosocial Discharge (Final Psychosocial Re-Evaluation):  Psychosocial Re-Evaluation - 04/10/20 1604      Psychosocial Re-Evaluation   Current issues with None Identified    Comments Patient reports no issues with their current mental states, sleep, stress, depression or anxiety. Will follow up with patient in a few weeks for any changes.    Expected Outcomes Short: Continue to exercise regularly to support mental health and notify staff of any changes. Long: maintain mental health and well being through teaching of rehab or prescribed medications independently.    Interventions Encouraged to attend Cardiac Rehabilitation for the exercise    Continue Psychosocial Services  Follow up required by staff           Vocational Rehabilitation: Provide vocational rehab assistance to qualifying candidates.   Vocational Rehab Evaluation & Intervention:  Vocational Rehab - 03/03/20 1406      Initial Vocational Rehab Evaluation & Intervention   Assessment shows need for Vocational Rehabilitation No   retired          Education: Education Goals: Education classes will be provided on a variety of topics geared toward better understanding of heart health and risk factor modification. Participant will state understanding/return demonstration of topics presented as noted by education test scores.  Learning Barriers/Preferences:  Learning Barriers/Preferences - 03/03/20 1405      Learning Barriers/Preferences    Learning Barriers Sight   glasses   Learning Preferences Skilled Demonstration           General Cardiac Education Topics:  AED/CPR: - Group verbal and written instruction with the use of models to demonstrate the basic use of the AED with the basic ABC's of resuscitation.   Anatomy & Physiology of the Heart: - Group verbal and written instruction and models provide basic cardiac anatomy and physiology, with the coronary electrical and arterial systems. Review of Valvular disease and Heart Failure   Cardiac Procedures: - Group verbal and written instruction to review commonly prescribed medications for heart disease. Reviews the medication, class of the drug, and side effects. Includes the steps to properly store meds and maintain the prescription regimen. (beta blockers and nitrates)   Cardiac Medications I: - Group verbal and written instruction to review commonly prescribed medications for heart disease. Reviews the medication, class of the drug, and side effects. Includes the steps to properly store meds and maintain the prescription regimen.   Cardiac Medications II: -Group verbal and written instruction to review commonly prescribed medications for heart disease. Reviews the medication, class of the drug, and side effects. (all other drug classes)    Go Sex-Intimacy & Heart Disease, Get SMART - Goal Setting: - Group verbal and written instruction through game format to discuss heart disease and the return to sexual intimacy. Provides group verbal and written material to discuss and apply goal setting through the application of the S.M.A.R.T. Method.   Other Matters of the Heart: - Provides group verbal, written materials and models to describe Stable Angina and Peripheral Artery. Includes description of the disease process and treatment options available to the cardiac patient.   Infection Prevention: - Provides verbal and  written material to individual with discussion of  infection control including proper hand washing and proper equipment cleaning during exercise session.   Cardiac Rehab from 03/04/2020 in [facility] Cardiac and Pulmonary Rehab  Date 03/04/20  Educator AS  Instruction Review Code 1- Verbalizes Understanding      Falls Prevention: - Provides verbal and written material to individual with discussion of falls prevention and safety.   Cardiac Rehab from 03/04/2020 in [facility] Cardiac and Pulmonary Rehab  Date 03/04/20  Educator AS  Instruction Review Code 1- Verbalizes Understanding      Other: -Provides group and verbal instruction on various topics (see comments)   Knowledge Questionnaire Score:  Knowledge Questionnaire Score - 03/04/20 1518      Knowledge Questionnaire Score   Pre Score 22/26           Core Components/Risk Factors/Patient Goals at Admission:  Personal Goals and Risk Factors at Admission - 03/04/20 1522      Core Components/Risk Factors/Patient Goals on Admission    Weight Management Yes    Intervention Weight Management: Develop a combined nutrition and exercise program designed to reach desired caloric intake, while maintaining appropriate intake of nutrient and fiber, sodium and fats, and appropriate energy expenditure required for the weight goal.;Weight Management: Provide education and appropriate resources to help participant work on and attain dietary goals.    Admit Weight 197 lb 9.6 oz (89.6 kg)    Goal Weight: Short Term 195 lb (88.5 kg)    Goal Weight: Long Term 190 lb (86.2 kg)    Expected Outcomes Short Term: Continue to assess and modify interventions until short term weight is achieved;Weight Maintenance: Understanding of the daily nutrition guidelines, which includes 25-35% calories from fat, 7% or less cal from saturated fats, less than 263m cholesterol, less than 1.5gm of sodium, & 5 or more servings of fruits and vegetables daily    Diabetes Yes    Intervention Provide education about signs/symptoms  and action to take for hypo/hyperglycemia.;Provide education about proper nutrition, including hydration, and aerobic/resistive exercise prescription along with prescribed medications to achieve blood glucose in normal ranges: Fasting glucose 65-99 mg/dL    Expected Outcomes Short Term: Participant verbalizes understanding of the signs/symptoms and immediate care of hyper/hypoglycemia, proper foot care and importance of medication, aerobic/resistive exercise and nutrition plan for blood glucose control.;Long Term: Attainment of HbA1C < 7%.    Hypertension Yes    Intervention Provide education on lifestyle modifcations including regular physical activity/exercise, weight management, moderate sodium restriction and increased consumption of fresh fruit, vegetables, and low fat dairy, alcohol moderation, and smoking cessation.;Monitor prescription use compliance.    Expected Outcomes Short Term: Continued assessment and intervention until BP is < 140/944mHG in hypertensive participants. < 130/8085mG in hypertensive participants with diabetes, heart failure or chronic kidney disease.;Long Term: Maintenance of blood pressure at goal levels.    Lipids Yes    Intervention Provide education and support for participant on nutrition & aerobic/resistive exercise along with prescribed medications to achieve LDL <43m14mDL >40mg59m Expected Outcomes Short Term: Participant states understanding of desired cholesterol values and is compliant with medications prescribed. Participant is following exercise prescription and nutrition guidelines.;Long Term: Cholesterol controlled with medications as prescribed, with individualized exercise RX and with personalized nutrition plan. Value goals: LDL < 43mg,14m > 40 mg.           Education:Diabetes - Individual verbal and written instruction to review signs/symptoms of diabetes, desired ranges of  glucose level fasting, after meals and with exercise. Acknowledge that pre  and post exercise glucose checks will be done for 3 sessions at entry of program.   Education: Know Your Numbers and Risk Factors: -Group verbal and written instruction about important numbers in your health.  Discussion of what are risk factors and how they play a role in the disease process.  Review of Cholesterol, Blood Pressure, Diabetes, and BMI and the role they play in your overall health.   Core Components/Risk Factors/Patient Goals Review:   Goals and Risk Factor Review    Row Name 03/27/20 1556 04/10/20 1602           Core Components/Risk Factors/Patient Goals Review   Personal Goals Review Weight Management/Obesity;Hypertension;Lipids Weight Management/Obesity;Hypertension;Diabetes;Improve shortness of breath with ADL's      Review Octavia Bruckner is doing well and his weight is staying steady. His blood pressures are doing well in class and at home.  He checks them routinely.  Overall, things are improving. Spoke to patient about their shortness of breath and what they can do to improve. Patient has been informed of breathing techniques when starting the program. Patient is informed to tell staff if they have had any med changes and that certain meds they are taking or not taking can be causing shortness of breath.      Expected Outcomes Short: Continue to work on weight loss Long; Continue to monitor risk factors. Short: Attend [facility] regularly to improve shortness of breath with ADL's. Long: maintain independence with ADL's             Core Components/Risk Factors/Patient Goals at Discharge (Final Review):   Goals and Risk Factor Review - 04/10/20 1602      Core Components/Risk Factors/Patient Goals Review   Personal Goals Review Weight Management/Obesity;Hypertension;Diabetes;Improve shortness of breath with ADL's    Review Spoke to patient about their shortness of breath and what they can do to improve. Patient has been informed of breathing techniques when starting the program.  Patient is informed to tell staff if they have had any med changes and that certain meds they are taking or not taking can be causing shortness of breath.    Expected Outcomes Short: Attend [facility] regularly to improve shortness of breath with ADL's. Long: maintain independence with ADL's           ITP Comments:  ITP Comments    Row Name 03/03/20 1425 03/04/20 1529 03/05/20 0847 03/05/20 1523 04/02/20 0611   ITP Comments Completed virtual orientation today.  EP evaluation is scheduled for Tuesday 5/18  at 2pm.  Documentation for diagnosis can be found in [facility] encounter 02/11/20. Completed 6MWT and gym orientation.  Initial ITP created and sent for review to Dr. Emily Filbert, Medical Director. 30 Day review completed. Medical Director review done, changes made as directed,and approval shown by signature of Market researcher.  New to program First full day of exercise!  Patient was oriented to gym and equipment including functions, settings, policies, and procedures.  Patient's individual exercise prescription and treatment plan were reviewed.  All starting workloads were established based on the results of the 6 minute walk test done at initial orientation visit.  The plan for exercise progression was also introduced and progression will be customized based on patient's performance and goals. 30 Day review completed. Medical Director ITP review done, changes made as directed, and signed approval by Medical Director.   [place] Name 04/30/20 [phone]  ITP Comments 30 Day review completed. Medical Director ITP review done, changes made as directed, and signed approval by Medical Director.              Comments:

## 2020-04-30 NOTE — Progress Notes (Signed)
Daily Session Note  Patient Details  Name: Brent Alvarado MRN: [id number] Date of Birth: 07/07/1946 Referring Provider:     Cardiac Rehab from 03/04/2020 in [facility] Cardiac and Pulmonary Rehab  Referring Provider Neoma Laming      Encounter Date: 04/30/2020  Check In:  Session Check In - 04/30/20 1558      Check-In   Supervising physician immediately available to respond to emergencies See telemetry face sheet for immediately available ER MD    Location [facility]-Cardiac & Pulmonary Rehab    Staff Present Renita Papa, RN BSN;Joseph Lou [place], [place] Exercise Physiologist    Virtual Visit No    Medication changes reported     No    Fall or balance concerns reported    No    Warm-up and Cool-down Performed on first and last piece of equipment    Resistance Training Performed Yes    VAD Patient? No    PAD/SET Patient? No      Pain Assessment   Currently in Pain? No/denies              Social History   Tobacco Use  Smoking Status Never Smoker  Smokeless Tobacco Never Used    Goals Met:  Independence with exercise equipment Exercise tolerated well No report of cardiac concerns or symptoms Strength training completed today  Goals Unmet:  Not Applicable  Comments: Pt able to follow exercise prescription today without complaint.  Will continue to monitor for progression.    Dr. Emily Filbert is Medical Director for [place] and [facility].

## 2020-05-01 ENCOUNTER — Other Ambulatory Visit: Payer: Self-pay

## 2020-05-01 ENCOUNTER — Encounter: Payer: Medicare Other | Admitting: *Deleted

## 2020-05-01 DIAGNOSIS — Z955 Presence of coronary angioplasty implant and graft: Secondary | ICD-10-CM | POA: Diagnosis not present

## 2020-05-01 NOTE — Progress Notes (Signed)
Daily Session Note  Patient Details  Name: Brent Alvarado MRN: [id number] Date of Birth: 11/15/1945 Referring Provider:     Cardiac Rehab from 03/04/2020 in [facility] Cardiac and Pulmonary Rehab  Referring Provider Khan, shaukat      Encounter Date: 05/01/2020  Check In:  Session Check In - 05/01/20 1605      Check-In   Supervising physician immediately available to respond to emergencies See telemetry face sheet for immediately available ER MD    Location [facility]-Cardiac & Pulmonary Rehab    Staff Present Meredith Craven, RN BSN;Joseph Hood RCP,RRT,BSRT;Laureen Brown, BS, RRT, CPFT;Kelly Hayes, BS, ACSM CEP, Exercise Physiologist    Virtual Visit No    Medication changes reported     No    Fall or balance concerns reported    No    Warm-up and Cool-down Performed on first and last piece of equipment    Resistance Training Performed Yes    VAD Patient? No    PAD/SET Patient? No      Pain Assessment   Currently in Pain? No/denies              Social History   Tobacco Use  Smoking Status Never Smoker  Smokeless Tobacco Never Used    Goals Met:  Independence with exercise equipment Exercise tolerated well No report of cardiac concerns or symptoms Strength training completed today  Goals Unmet:  Not Applicable  Comments: Pt able to follow exercise prescription today without complaint.  Will continue to monitor for progression.    Dr. Mark Miller is Medical Director for [facility] and [facility]. 

## 2020-05-15 ENCOUNTER — Other Ambulatory Visit: Payer: Self-pay

## 2020-05-15 ENCOUNTER — Encounter: Payer: Medicare Other | Admitting: *Deleted

## 2020-05-15 DIAGNOSIS — Z955 Presence of coronary angioplasty implant and graft: Secondary | ICD-10-CM

## 2020-05-15 NOTE — Progress Notes (Signed)
Daily Session Note  Patient Details  Name: Brent Alvarado MRN: [id number] Date of Birth: 10/03/1946 Referring Provider:     Cardiac Rehab from 03/04/2020 in [facility] Cardiac and Pulmonary Rehab  Referring Provider Neoma Laming      Encounter Date: 05/15/2020  Check In:  Session Check In - 05/15/20 1608      Check-In   Supervising physician immediately available to respond to emergencies See telemetry face sheet for immediately available ER MD    Location [facility]-Cardiac & Pulmonary Rehab    Staff Present Renita Papa, RN BSN;Joseph Foy [place], [place], ACSM CEP, Exercise Physiologist;Kelly Amedeo Plenty, BS, ACSM CEP, Exercise Physiologist    Virtual Visit No    Medication changes reported     No    Fall or balance concerns reported    No    Warm-up and Cool-down Performed on first and last piece of equipment    Resistance Training Performed Yes    VAD Patient? No    PAD/SET Patient? No      Pain Assessment   Currently in Pain? No/denies              Social History   Tobacco Use  Smoking Status Never Smoker  Smokeless Tobacco Never Used    Goals Met:  Independence with exercise equipment Exercise tolerated well No report of cardiac concerns or symptoms Strength training completed today  Goals Unmet:  Not Applicable  Comments: Pt able to follow exercise prescription today without complaint.  Will continue to monitor for progression.    Dr. Emily Filbert is Medical Director for [place] and [facility].

## 2020-05-22 ENCOUNTER — Other Ambulatory Visit: Payer: Self-pay

## 2020-05-22 ENCOUNTER — Encounter: Payer: Medicare Other | Attending: Cardiovascular Disease | Admitting: *Deleted

## 2020-05-22 DIAGNOSIS — Z955 Presence of coronary angioplasty implant and graft: Secondary | ICD-10-CM | POA: Insufficient documentation

## 2020-05-22 DIAGNOSIS — Z79899 Other long term (current) drug therapy: Secondary | ICD-10-CM | POA: Diagnosis not present

## 2020-05-22 NOTE — Progress Notes (Signed)
Daily Session Note  Patient Details  Name: Brent Alvarado MRN: [id number] Date of Birth: 09/08/1946 Referring Provider:     Cardiac Rehab from 03/04/2020 in [facility] Cardiac and Pulmonary Rehab  Referring Provider Khan, shaukat      Encounter Date: 05/22/2020  Check In:  Session Check In - 05/22/20 1558      Check-In   Supervising physician immediately available to respond to emergencies See telemetry face sheet for immediately available ER MD    Location [facility]-Cardiac & Pulmonary Rehab    Staff Present Joseph Hood RCP,RRT,BSRT;Laureen Brown, BS, RRT, CPFT;Meredith Craven, RN BSN    Virtual Visit No    Medication changes reported     No    Fall or balance concerns reported    No    Warm-up and Cool-down Performed on first and last piece of equipment    Resistance Training Performed Yes    VAD Patient? No    PAD/SET Patient? No      Pain Assessment   Currently in Pain? No/denies              Social History   Tobacco Use  Smoking Status Never Smoker  Smokeless Tobacco Never Used    Goals Met:  Independence with exercise equipment Exercise tolerated well No report of cardiac concerns or symptoms Strength training completed today  Goals Unmet:  Not Applicable  Comments: Pt able to follow exercise prescription today without complaint.  Will continue to monitor for progression.    Dr. Mark Miller is Medical Director for [facility] and [facility]. 

## 2020-05-26 ENCOUNTER — Encounter: Payer: Medicare Other | Admitting: *Deleted

## 2020-05-26 ENCOUNTER — Other Ambulatory Visit: Payer: Self-pay

## 2020-05-26 DIAGNOSIS — Z955 Presence of coronary angioplasty implant and graft: Secondary | ICD-10-CM | POA: Diagnosis not present

## 2020-05-26 NOTE — Progress Notes (Signed)
Daily Session Note  Patient Details  Name: Brent Alvarado MRN: [id number] Date of Birth: Nov 27, 1945 Referring Provider:     Cardiac Rehab from 03/04/2020 in [facility] Cardiac and Pulmonary Rehab  Referring Provider Neoma Laming      Encounter Date: 05/26/2020  Check In:  Session Check In - 05/26/20 1607      Check-In   Supervising physician immediately available to respond to emergencies See telemetry face sheet for immediately available ER MD    Location [facility]-Cardiac & Pulmonary Rehab    Staff Present Renita Papa, RN Margurite Auerbach, MS Exercise Physiologist;Kelly Amedeo Plenty, BS, ACSM CEP, Exercise Physiologist    Virtual Visit No    Medication changes reported     No    Fall or balance concerns reported    No    Warm-up and Cool-down Performed on first and last piece of equipment    Resistance Training Performed Yes    VAD Patient? No    PAD/SET Patient? No      Pain Assessment   Currently in Pain? No/denies              Social History   Tobacco Use  Smoking Status Never Smoker  Smokeless Tobacco Never Used    Goals Met:  Independence with exercise equipment Exercise tolerated well No report of cardiac concerns or symptoms Strength training completed today  Goals Unmet:  Not Applicable  Comments: Pt able to follow exercise prescription today without complaint.  Will continue to monitor for progression.    Dr. Emily Filbert is Medical Director for [place] and [facility].

## 2020-05-28 ENCOUNTER — Encounter: Payer: Medicare Other | Admitting: *Deleted

## 2020-05-28 ENCOUNTER — Encounter: Payer: Self-pay | Admitting: *Deleted

## 2020-05-28 ENCOUNTER — Other Ambulatory Visit: Payer: Self-pay

## 2020-05-28 DIAGNOSIS — Z955 Presence of coronary angioplasty implant and graft: Secondary | ICD-10-CM

## 2020-05-28 NOTE — Progress Notes (Signed)
Cardiac Individual Treatment Plan  Patient Details  Name: Brent Alvarado MRN: [id number] Date of Birth: 05-20-1946 Referring Provider:     Cardiac Rehab from 03/04/2020 in [facility] Cardiac and Pulmonary Rehab  Referring Provider Humphrey Rolls, [place]      Initial Encounter Date:    Cardiac Rehab from 03/04/2020 in [facility] Cardiac and Pulmonary Rehab  Date 03/04/20      Visit Diagnosis: Status post coronary artery stent placement  Patient's Home Medications on Admission:  Current Outpatient Medications:  .  amLODipine (NORVASC) 10 MG tablet, Take 10 mg by mouth daily., Disp: , Rfl:  .  aspirin 81 MG chewable tablet, Chew 1 tablet (81 mg total) by mouth daily. (Patient not taking: Reported on 03/03/2020), Disp: 30 tablet, Rfl: 0 .  baclofen (LIORESAL) 10 MG tablet, Take 10 mg by mouth 3 (three) times daily., Disp: , Rfl:  .  clopidogrel (PLAVIX) 75 MG tablet, Take 1 tablet (75 mg total) by mouth daily with breakfast., Disp: 30 tablet, Rfl: 0 .  Dulaglutide (TRULICITY) 4.58 KD/9.8PJ SOPN, Inject into the skin once a week., Disp: , Rfl:  .  fenofibrate 54 MG tablet, Take 54 mg by mouth daily., Disp: , Rfl:  .  furosemide (LASIX) 20 MG tablet, Take 20 mg by mouth daily. , Disp: , Rfl:  .  glimepiride (AMARYL) 4 MG tablet, Take 1 tablet by mouth daily., Disp: , Rfl:  .  losartan (COZAAR) 50 MG tablet, Take 50 mg by mouth every morning., Disp: , Rfl:  .  nitroGLYCERIN (NITROSTAT) 0.4 MG SL tablet, Place 1 tablet (0.4 mg total) under the tongue every 5 (five) minutes x 3 doses as needed for chest pain., Disp: 20 tablet, Rfl: 1 .  ondansetron (ZOFRAN) 4 MG tablet, TAKE 1 TABLET BY MOUTH 3 TIMES DAILY AS NEEDED FOR NAUSEA FOR UP TO 7 DAYS, Disp: , Rfl:  .  pantoprazole (PROTONIX) 40 MG tablet, Take 1 tablet by mouth 2 (two) times daily., Disp: , Rfl:  .  PARoxetine (PAXIL) 40 MG tablet, Take 40 mg by mouth every morning., Disp: , Rfl:  .  polyethylene glycol (MIRALAX / GLYCOLAX) 17 g packet, Take 17 g by  mouth daily., Disp: , Rfl:  .  rosuvastatin (CRESTOR) 40 MG tablet, Take 1 tablet by mouth daily., Disp: , Rfl:  .  sotalol (BETAPACE) 80 MG tablet, Take 80 mg by mouth 2 (two) times daily., Disp: , Rfl:  .  tamsulosin (FLOMAX) 0.4 MG CAPS capsule, Take 0.4 mg by mouth., Disp: , Rfl:  .  XARELTO 15 MG TABS tablet, Take 15 mg by mouth daily., Disp: , Rfl:  No current facility-administered medications for this visit.  Facility-Administered Medications Ordered in Other Visits:  .  0.9 %  sodium chloride infusion, 250 mL, Intravenous, PRN, Neoma Laming A, MD .  sodium chloride flush (NS) 0.9 % injection 3 mL, 3 mL, Intravenous, Q12H, Neoma Laming A, MD .  sodium chloride flush (NS) 0.9 % injection 3 mL, 3 mL, Intravenous, PRN, Neoma Laming A, MD .  sodium chloride flush (NS) 0.9 % injection 3 mL, 3 mL, Intravenous, Q12H, Dionisio David, MD  Past Medical History: Past Medical History:  Diagnosis Date  . Anxiety   . BPH (benign prostatic hyperplasia)   . Depression   . Diabetes mellitus without complication ([place])    Controlled poorly with metformin  . Diverticulitis   . Dyspnea   . Dysrhythmia   . GERD (gastroesophageal reflux disease)   .  Hyperlipidemia   . Hypertension     Tobacco Use: Social History   Tobacco Use  Smoking Status Never Smoker  Smokeless Tobacco Never Used    Labs: Recent Review Flowsheet Data    Labs for ITP Cardiac and Pulmonary Rehab Latest Ref Rng & Units 02/11/2020   Hemoglobin A1c 4.8 - 5.6 % 6.5(H)       Exercise Target Goals: Exercise Program Goal: Individual exercise prescription set using results from initial 6 min walk test and THRR while considering  patient's activity barriers and safety.   Exercise Prescription Goal: Initial exercise prescription builds to 30-45 minutes a day of aerobic activity, 2-3 days per week.  Home exercise guidelines will be given to patient during program as part of exercise prescription that the participant will  acknowledge.   Education: Aerobic Exercise & Resistance Training: - Gives group verbal and written instruction on the various components of exercise. Focuses on aerobic and resistive training programs and the benefits of this training and how to safely progress through these programs..   Education: Exercise & Equipment Safety: - Individual verbal instruction and demonstration of equipment use and safety with use of the equipment.   Cardiac Rehab from 04/30/2020 in [facility] Cardiac and Pulmonary Rehab  Date 03/04/20  Educator AS  Instruction Review Code 1- Verbalizes Understanding      Education: Exercise Physiology & General Exercise Guidelines: - Group verbal and written instruction with models to review the exercise physiology of the cardiovascular system and associated critical values. Provides general exercise guidelines with specific guidelines to those with heart or lung disease.    Cardiac Rehab from 04/30/2020 in [facility] Cardiac and Pulmonary Rehab  Date 04/30/20  Educator AS  Instruction Review Code 1- Verbalizes Understanding      Education: Flexibility, Balance, Mind/Body Relaxation: Provides group verbal/written instruction on the benefits of flexibility and balance training, including mind/body exercise modes such as yoga, pilates and tai chi.  Demonstration and skill practice provided.   Activity Barriers & Risk Stratification:  Activity Barriers & Cardiac Risk Stratification - 03/03/20 1405      Activity Barriers & Cardiac Risk Stratification   Activity Barriers Back Problems;Neck/Spine Problems;Arthritis;Deconditioning;Muscular Weakness;Shortness of Breath   chronic low back pain   Cardiac Risk Stratification Moderate           6 Minute Walk:  6 Minute Walk    Row Name 03/04/20 1512         6 Minute Walk   Phase Initial     Distance 1098 feet     Walk Time 6 minutes     # of Rest Breaks 0     MPH 2.08     METS 2.13     RPE 13     Perceived Dyspnea  2      VO2 Peak 7.45     Symptoms Yes (comment)     Comments hip pain ( been for 30 years)     Resting HR 65 bpm     Resting BP 108/64     Resting Oxygen Saturation  97 %     Exercise Oxygen Saturation  during 6 min walk 98 %     Max Ex. HR 90 bpm     Max Ex. BP 110/54     2 Minute Post BP 110/64            Oxygen Initial Assessment:   Oxygen Re-Evaluation:   Oxygen Discharge (Final Oxygen Re-Evaluation):   Initial Exercise Prescription:  Initial Exercise Prescription - 03/04/20 1500      Date of Initial Exercise RX and Referring Provider   Date 03/04/20    Referring Provider Humphrey Rolls, sh[place]      Treadmill   MPH 1.5    Grade 0    Minutes 15    METs 2.15      Recumbant Bike   Level 1    RPM 60    Minutes 15    METs 2.13      NuStep   Level 2    SPM 80    Minutes 15    METs 2.13      REL-XR   Level 2    Speed 50    Minutes 15    METs 2.13      Prescription Details   Frequency (times per week) 3    Duration Progress to 30 minutes of continuous aerobic without signs/symptoms of physical distress      Intensity   THRR 40-80% of Max Heartrate 97-129    Ratings of Perceived Exertion 11-15    Perceived Dyspnea 0-4      Resistance Training   Training Prescription Yes    Weight 3 lb    Reps 10-15           Perform Capillary Blood Glucose checks as needed.  Exercise Prescription Changes:  Exercise Prescription Changes    Row Name 03/04/20 1500 03/13/20 1500 03/28/20 0800 04/07/20 1600 04/22/20 1400     Response to Exercise   Blood Pressure (Admit) 108/64 116/60 128/74 122/64 122/64   Blood Pressure (Exercise) 110/54 120/64 140/74 120/60 124/68   Blood Pressure (Exit) 110/64 100/50 106/64 102/60 118/68   Heart Rate (Admit) 65 bpm 61 bpm 68 bpm 65 bpm 65 bpm   Heart Rate (Exercise) 90 bpm 86 bpm 104 bpm 73 bpm 99 bpm   Heart Rate (Exit) 75 bpm 77 bpm 73 bpm 69 bpm 77 bpm   Oxygen Saturation (Admit) 97 % -- -- -- --   Oxygen Saturation (Exercise) 98 %  -- -- -- --   Rating of Perceived Exertion (Exercise) 13 13 13 12 13    Perceived Dyspnea (Exercise) 2 -- -- -- --   Symptoms hip pain none none none none   Duration -- Progress to 30 minutes of  aerobic without signs/symptoms of physical distress Continue with 30 min of aerobic exercise without signs/symptoms of physical distress. Continue with 30 min of aerobic exercise without signs/symptoms of physical distress. Continue with 30 min of aerobic exercise without signs/symptoms of physical distress.   Intensity -- THRR unchanged THRR unchanged THRR unchanged THRR unchanged     Progression   Progression -- Continue to progress workloads to maintain intensity without signs/symptoms of physical distress. Continue to progress workloads to maintain intensity without signs/symptoms of physical distress. Continue to progress workloads to maintain intensity without signs/symptoms of physical distress. Continue to progress workloads to maintain intensity without signs/symptoms of physical distress.   Average METs -- 2.35 2.08 2.29 2.27     Resistance Training   Training Prescription -- Yes Yes Yes Yes   Weight -- 3 lb 3 lb 3 lb 3 lb   Reps -- 10-15 10-15 10-15 10-15     Interval Training   Interval Training -- No No No No     Treadmill   MPH -- 1.3 1.2 1.5 1.7   Grade -- 0 0.5 0 0   Minutes -- 15 15 15 15    METs --  2 2 2.15 2.3     Recumbant Bike   Level -- -- 1 3 --   Watts -- -- 16 16 --   Minutes -- -- 15 15 --   METs -- -- 2.15 2.15 --     NuStep   Level -- 2 3 3 3    Minutes -- 15 15 15 15    METs -- 2.7 2.6 2.2 2     REL-XR   Level -- 2 2 2 3    Minutes -- 15 15 15 15    METs -- -- 1.2 -- --     Home Exercise Plan   Plans to continue exercise at -- -- Home (comment)  walking and staff videos Home (comment)  walking and staff videos Home (comment)  walking and staff videos   Frequency -- -- Add 2 additional days to program exercise sessions. Add 2 additional days to program  exercise sessions. Add 2 additional days to program exercise sessions.   Initial Home Exercises Provided -- -- 03/27/20 03/27/20 03/27/20   Row Name 05/05/20 1500 05/21/20 1300           Response to Exercise   Blood Pressure (Admit) 110/60 130/64      Blood Pressure (Exercise) 118/60 148/76      Blood Pressure (Exit) 114/64 104/70      Heart Rate (Admit) 67 bpm 73 bpm      Heart Rate (Exercise) 77 bpm 81 bpm      Heart Rate (Exit) 68 bpm 72 bpm      Rating of Perceived Exertion (Exercise) 11 15      Symptoms none none      Duration Continue with 30 min of aerobic exercise without signs/symptoms of physical distress. Continue with 30 min of aerobic exercise without signs/symptoms of physical distress.      Intensity THRR unchanged THRR unchanged        Progression   Progression Continue to progress workloads to maintain intensity without signs/symptoms of physical distress. Continue to progress workloads to maintain intensity without signs/symptoms of physical distress.      Average METs 2.18 2        Resistance Training   Training Prescription Yes Yes      Weight 3 lb 3 lb      Reps 10-15 10-15        Interval Training   Interval Training No No        Treadmill   MPH 1.6 1.3      Grade 0 0      Minutes 15 15      METs 2.23 2        Recumbant Bike   Level 3 --      Minutes 15 --      METs 2.5 --        NuStep   Level 4 --      Minutes 15 --      METs 2.1 --        T5 Nustep   Level 2 --      Minutes 15 --      METs 1.9 --        Home Exercise Plan   Plans to continue exercise at Home (comment)  walking and staff videos Home (comment)  walking and staff videos      Frequency Add 2 additional days to program exercise sessions. Add 2 additional days to program exercise sessions.      Initial Home  Exercises Provided 03/27/20 03/27/20             Exercise Comments:   Exercise Goals and Review:  Exercise Goals    Row Name 03/04/20 1516              Exercise Goals   Increase Physical Activity Yes       Intervention Provide advice, education, support and counseling about physical activity/exercise needs.;Develop an individualized exercise prescription for aerobic and resistive training based on initial evaluation findings, risk stratification, comorbidities and participant's personal goals.       Expected Outcomes Short Term: Attend rehab on a regular basis to increase amount of physical activity.;Long Term: Add in home exercise to make exercise part of routine and to increase amount of physical activity.;Long Term: Exercising regularly at least 3-5 days a week.       Increase Strength and Stamina Yes       Intervention Provide advice, education, support and counseling about physical activity/exercise needs.;Develop an individualized exercise prescription for aerobic and resistive training based on initial evaluation findings, risk stratification, comorbidities and participant's personal goals.       Expected Outcomes Short Term: Increase workloads from initial exercise prescription for resistance, speed, and METs.;Short Term: Perform resistance training exercises routinely during rehab and add in resistance training at home;Long Term: Improve cardiorespiratory fitness, muscular endurance and strength as measured by increased METs and functional capacity (6MWT)       Able to understand and use rate of perceived exertion (RPE) scale Yes       Intervention Provide education and explanation on how to use RPE scale       Expected Outcomes Short Term: Able to use RPE daily in rehab to express subjective intensity level;Long Term:  Able to use RPE to guide intensity level when exercising independently       Able to understand and use Dyspnea scale Yes       Intervention Provide education and explanation on how to use Dyspnea scale       Expected Outcomes Short Term: Able to use Dyspnea scale daily in rehab to express subjective sense of shortness of breath  during exertion;Long Term: Able to use Dyspnea scale to guide intensity level when exercising independently       Knowledge and understanding of Target Heart Rate Range (THRR) Yes       Intervention Provide education and explanation of THRR including how the numbers were predicted and where they are located for reference       Expected Outcomes Short Term: Able to state/look up THRR;Short Term: Able to use daily as guideline for intensity in rehab;Long Term: Able to use THRR to govern intensity when exercising independently       Able to check pulse independently Yes       Intervention Provide education and demonstration on how to check pulse in carotid and radial arteries.;Review the importance of being able to check your own pulse for safety during independent exercise       Expected Outcomes Short Term: Able to explain why pulse checking is important during independent exercise;Long Term: Able to check pulse independently and accurately       Understanding of Exercise Prescription Yes       Intervention Provide education, explanation, and written materials on patient's individual exercise prescription       Expected Outcomes Short Term: Able to explain program exercise prescription;Long Term: Able to explain home exercise prescription to exercise independently  Exercise Goals Re-Evaluation :  Exercise Goals Re-Evaluation    Row Name 03/05/20 1523 03/13/20 1556 03/27/20 1553 04/07/20 1623 04/10/20 1607     Exercise Goal Re-Evaluation   Exercise Goals Review Increase Physical Activity;Knowledge and understanding of Target Heart Rate Range (THRR);Able to understand and use rate of perceived exertion (RPE) scale;Understanding of Exercise Prescription;Increase Strength and Stamina;Able to check pulse independently Increase Physical Activity;Increase Strength and Stamina;Understanding of Exercise Prescription Increase Physical Activity;Increase Strength and Stamina;Understanding of  Exercise Prescription Increase Physical Activity;Increase Strength and Stamina;Understanding of Exercise Prescription Increase Strength and Stamina;Increase Physical Activity   Comments Reviewed RPE and dyspnea scales, THR and program prescription with pt today.  Pt voiced understanding and was given a copy of goals to take home. Brent Alvarado is off to a good start in rehab.  He has taken to the exercise very well with his frist four days of exercise.  We will continue to monitor her progress. Brent Alvarado is doing well in rehab.  He is starting to notice that his strength and stamina are starting to recover.  He has also noticed that his breathing is getting better as it is easier to do things.  Reviewed home exercise with pt today.  Pt plans to walk and staff videos at home for exercise.  Reviewed THR, pulse, RPE, sign and symptoms, pulse oximetery and when to call 911 or MD.  Also discussed weather considerations and indoor options.  Pt voiced understanding. Brent Alvarado continues to do well in rehab.  He is up to 16 watts on the bike.  We will continue to montior his progress. Patient states that he is not doing any exercise at home. He does not have the motivation to workout at home. Talked to patient why it is important to exercise at home. He states that he is able to monitor his heart rate at home. He is onboard with trying to walk a few days a week outside of rehab.   Expected Outcomes Short: Use RPE daily to regulate intensity. Long: Follow program prescription in THR. Short: Continue to attend regularly Long: Continue to follow program prescription. Short: Start to add in more exercise at home. Long: Continue to improve stamina. Short: Increase treadmill Long: Continue to improve stamina. Short: add two days of walking at home. Long: add a routine of home exercise daily after [place] Name 04/22/20 1424 05/05/20 1525 05/21/20 1338 05/26/20 1643       Exercise Goal Re-Evaluation   Exercise Goals Review Increase  Strength and Stamina;Increase Physical Activity;Understanding of Exercise Prescription Increase Strength and Stamina;Increase Physical Activity;Understanding of Exercise Prescription Increase Strength and Stamina;Increase Physical Activity;Understanding of Exercise Prescription Increase Strength and Stamina;Increase Physical Activity;Understanding of Exercise Prescription    Comments Brent Alvarado is doing well, he has increased to a 1.7 speed on the treadmill. Will continue to focus on increasing workloads. Brent Alvarado continues to well in rehab.  He is now up to to 2.5 METs on bike.  We will continue to monitor his progress. Tim missed a couple weeks due to illness.  He is working on building back up to previous levels. Brent Alvarado is doing well in rehab. He has not been exercising at home as he just doesn't feel like it. We talked about how exercise will help him feel better and improve his breathing.    Expected Outcomes Short: Continue attending rehab regularly Long: Increase MET level and stamina/ endurance Short: Increase treadmill back up and increase T5 NuStep.  Long; Continue to improve stamina.  Short: get back to regular attendance Long:  work up to previous intensity Short: Add in exercise at home.  Long: Continue to improve stamina.           Discharge Exercise Prescription (Final Exercise Prescription Changes):  Exercise Prescription Changes - 05/21/20 1300      Response to Exercise   Blood Pressure (Admit) 130/64    Blood Pressure (Exercise) 148/76    Blood Pressure (Exit) 104/70    Heart Rate (Admit) 73 bpm    Heart Rate (Exercise) 81 bpm    Heart Rate (Exit) 72 bpm    Rating of Perceived Exertion (Exercise) 15    Symptoms none    Duration Continue with 30 min of aerobic exercise without signs/symptoms of physical distress.    Intensity THRR unchanged      Progression   Progression Continue to progress workloads to maintain intensity without signs/symptoms of physical distress.    Average METs 2       Resistance Training   Training Prescription Yes    Weight 3 lb    Reps 10-15      Interval Training   Interval Training No      Treadmill   MPH 1.3    Grade 0    Minutes 15    METs 2      Home Exercise Plan   Plans to continue exercise at Home (comment)   walking and staff videos   Frequency Add 2 additional days to program exercise sessions.    Initial Home Exercises Provided 03/27/20           Nutrition:  Target Goals: Understanding of nutrition guidelines, daily intake of sodium <1548m, cholesterol <2046m calories 30% from fat and 7% or less from saturated fats, daily to have 5 or more servings of fruits and vegetables.  Education: Controlling Sodium/Reading Food Labels -Group verbal and written material supporting the discussion of sodium use in heart healthy nutrition. Review and explanation with models, verbal and written materials for utilization of the food label.   Education: General Nutrition Guidelines/Fats and Fiber: -Group instruction provided by verbal, written material, models and posters to present the general guidelines for heart healthy nutrition. Gives an explanation and review of dietary fats and fiber.   Biometrics:  Pre Biometrics - 03/04/20 1517      Pre Biometrics   Height 5' 11.5" (1.816 m)    Weight 197 lb 9.6 oz (89.6 kg)    BMI (Calculated) 27.18    Single Leg Stand 7.75 seconds            Nutrition Therapy Plan and Nutrition Goals:  Nutrition Therapy & Goals - 03/31/20 1604      Nutrition Therapy   Diet heart healthy, low Na, diabetes friendly    Protein (specify units) 70-75g    Fiber 30 grams    Whole Grain Foods 3 servings    Saturated Fats 12 max. grams    Fruits and Vegetables 5 servings/day    Sodium 1.5 grams      Personal Nutrition Goals   Nutrition Goal ST:LT: no goals at this time    Comments A1C: 6. grape nuts, sandwich for lunch [place] or chicken salad, wife fixes dinner - mostly protein (bologna, hamburger,  etc.). Discussed heart healthy eating and diabetes friendly eating. Will f/u and include wife for re-evaluation.      Intervention Plan   Intervention Prescribe, educate and counsel regarding individualized specific dietary modifications aiming towards targeted core components  such as weight, hypertension, lipid management, diabetes, heart failure and other comorbidities.;Nutrition handout(s) given to patient.    Expected Outcomes Short Term Goal: Understand basic principles of dietary content, such as calories, fat, sodium, cholesterol and nutrients.;Short Term Goal: A plan has been developed with personal nutrition goals set during dietitian appointment.;Long Term Goal: Adherence to prescribed nutrition plan.           Nutrition Assessments:  Nutrition Assessments - 03/05/20 0919      MEDFICTS Scores   Pre Score 86           MEDIFICTS Score Key:          ?70 Need to make dietary changes          40-70 Heart Healthy Diet         ? 40 Therapeutic Level Cholesterol Diet  Nutrition Goals Re-Evaluation:  Nutrition Goals Re-Evaluation    [place] Name 05/26/20 1637             Goals   Comment Tim continues to eat as healthy as he can.  He still will eat what he wants but does not over do it. He continues to try to pick healthier snacks.       Expected Outcome Short: Continue to work on eating better snacks.  Long: Continue to eat heart healthy.              Nutrition Goals Discharge (Final Nutrition Goals Re-Evaluation):  Nutrition Goals Re-Evaluation - 05/26/20 1637      Goals   Comment Tim continues to eat as healthy as he can.  He still will eat what he wants but does not over do it. He continues to try to pick healthier snacks.    Expected Outcome Short: Continue to work on eating better snacks.  Long: Continue to eat heart healthy.           Psychosocial: Target Goals: Acknowledge presence or absence of significant depression and/or stress, maximize coping skills,  provide positive support system. Participant is able to verbalize types and ability to use techniques and skills needed for reducing stress and depression.   Education: Depression - Provides group verbal and written instruction on the correlation between heart/lung disease and depressed mood, treatment options, and the stigmas associated with seeking treatment.   Education: Sleep Hygiene -Provides group verbal and written instruction about how sleep can affect your health.  Define sleep hygiene, discuss sleep cycles and impact of sleep habits. Review good sleep hygiene tips.     Education: Stress and Anxiety: - Provides group verbal and written instruction about the health risks of elevated stress and causes of high stress.  Discuss the correlation between heart/lung disease and anxiety and treatment options. Review healthy ways to manage with stress and anxiety.    Initial Review & Psychosocial Screening:  Initial Psych Review & Screening - 03/03/20 1406      Initial Review   Current issues with History of Depression;Current Psychotropic Meds;Current Anxiety/Panic;Current Depression;Current Stress Concerns    Source of Stress Concerns Chronic Illness    Comments more depression then anxiety and currently well managed on meds with Dr. Sabra Heck, overall doing well      [place]? Yes   wife, daughter (lives within a few miles)     Barriers   Psychosocial barriers to participate in program The patient should benefit from training in stress management and relaxation.;Psychosocial barriers identified (see note)  Screening Interventions   Interventions Encouraged to exercise;Provide feedback about the scores to participant;To provide support and resources with identified psychosocial needs    Expected Outcomes Short Term goal: Utilizing psychosocial counselor, staff and physician to assist with identification of specific Stressors or current issues interfering  with healing process. Setting desired goal for each stressor or current issue identified.;Long Term Goal: Stressors or current issues are controlled or eliminated.;Short Term goal: Identification and review with participant of any Quality of Life or Depression concerns found by scoring the questionnaire.;Long Term goal: The participant improves quality of Life and PHQ9 Scores as seen by post scores and/or verbalization of changes           Quality of Life Scores:   Quality of Life - 03/04/20 1518      Quality of Life   Select Quality of Life      Quality of Life Scores   Health/Function Pre 20.2 %    Socioeconomic Pre 21.07 %    Psych/Spiritual Pre 24.43 %    Family Pre 22.8 %    GLOBAL Pre 21.63 %          Scores of 19 and below usually indicate a poorer quality of life in these areas.  A difference of  2-3 points is a clinically meaningful difference.  A difference of 2-3 points in the total score of the Quality of Life Index has been associated with significant improvement in overall quality of life, self-image, physical symptoms, and general health in studies assessing change in quality of life.  PHQ-9: Recent Review Flowsheet Data    Depression screen [facility] 2/9 03/04/2020 11/20/2018   Decreased Interest 0 0   Down, Depressed, Hopeless 0 0   PHQ - 2 Score 0 0   Altered sleeping 0 -   Tired, decreased energy 2 -   Change in appetite 0 -   Feeling bad or failure about yourself  0 -   Trouble concentrating 0 -   Moving slowly or fidgety/restless 0 -   Suicidal thoughts 0 -   PHQ-9 Score 2 -     Interpretation of Total Score  Total Score Depression Severity:  1-4 = Minimal depression, 5-9 = Mild depression, 10-14 = Moderate depression, 15-19 = Moderately severe depression, 20-27 = Severe depression   Psychosocial Evaluation and Intervention:  Psychosocial Evaluation - 03/03/20 1413      Psychosocial Evaluation & Interventions   Interventions Stress management  education;Encouraged to exercise with the program and follow exercise prescription    Comments Brent Alvarado is 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  He has a great support system in his wife and daughter (who lives near by).  He really wants to try to avoid any of this happening agin. He has never really exercised consistently before and is curious as to how it will go for him.  He wants to make sure he does not have any drawbacks.  He does have a history of depression with a little bit of anxiety but is currently well managed on plaxil.  Exercise should also help give him a mental boost.    Expected Outcomes Short: Attend rehab for mood boost with exercise. Long: Get into exercise routine.    Continue Psychosocial Services  Follow up required by staff           Psychosocial Re-Evaluation:  Psychosocial Re-Evaluation    [place] Name 03/27/20 1555 04/10/20 1604 05/26/20 1641  Psychosocial Re-Evaluation   Current issues with Current Stress Concerns None Identified None Identified     Comments Brent Alvarado is doing well in rehab.  He is doing well mentally as well and denies current symptoms of depression and anxiety.  He is sleeping good most nights. Overall things are going well. Patient reports no issues with their current mental states, sleep, stress, depression or anxiety. Will follow up with patient in a few weeks for any changes. Brent Alvarado is doing well in rehab.  He is sleeping well and denies any current stressors.  He was sick a few weeks ago, but is now feeling much better!  He does not exericse at home.  His biggest concern continues to be his breathing which limits what he can do.     Expected Outcomes Short: Continue to attend rehab for strength  Long: Continue to stay positive. Short: Continue to exercise regularly to support mental health and notify staff of any changes. Long: maintain mental health and well being through teaching of rehab or prescribed medications independently.  Short: Continue to work on breathing Long: Continue to stay positive     Interventions Encouraged to attend Cardiac Rehabilitation for the exercise Encouraged to attend Cardiac Rehabilitation for the exercise Encouraged to attend Cardiac Rehabilitation for the exercise     Continue Psychosocial Services  Follow up required by staff Follow up required by staff Follow up required by staff            Psychosocial Discharge (Final Psychosocial Re-Evaluation):  Psychosocial Re-Evaluation - 05/26/20 1641      Psychosocial Re-Evaluation   Current issues with None Identified    Comments Brent Alvarado is doing well in rehab.  He is sleeping well and denies any current stressors.  He was sick a few weeks ago, but is now feeling much better!  He does not exericse at home.  His biggest concern continues to be his breathing which limits what he can do.    Expected Outcomes Short: Continue to work on breathing Long: Continue to stay positive    Interventions Encouraged to attend Cardiac Rehabilitation for the exercise    Continue Psychosocial Services  Follow up required by staff           Vocational Rehabilitation: Provide vocational rehab assistance to qualifying candidates.   Vocational Rehab Evaluation & Intervention:  Vocational Rehab - 03/03/20 1406      Initial Vocational Rehab Evaluation & Intervention   Assessment shows need for Vocational Rehabilitation No   retired          Education: Education Goals: Education classes will be provided on a variety of topics geared toward better understanding of heart health and risk factor modification. Participant will state understanding/return demonstration of topics presented as noted by education test scores.  Learning Barriers/Preferences:  Learning Barriers/Preferences - 03/03/20 1405      Learning Barriers/Preferences   Learning Barriers Sight   glasses   Learning Preferences Skilled Demonstration           General Cardiac Education  Topics:  AED/CPR: - Group verbal and written instruction with the use of models to demonstrate the basic use of the AED with the basic ABC's of resuscitation.   Anatomy & Physiology of the Heart: - Group verbal and written instruction and models provide basic cardiac anatomy and physiology, with the coronary electrical and arterial systems. Review of Valvular disease and Heart Failure   Cardiac Procedures: - Group verbal and written instruction to review commonly prescribed  medications for heart disease. Reviews the medication, class of the drug, and side effects. Includes the steps to properly store meds and maintain the prescription regimen. (beta blockers and nitrates)   Cardiac Medications I: - Group verbal and written instruction to review commonly prescribed medications for heart disease. Reviews the medication, class of the drug, and side effects. Includes the steps to properly store meds and maintain the prescription regimen.   Cardiac Medications II: -Group verbal and written instruction to review commonly prescribed medications for heart disease. Reviews the medication, class of the drug, and side effects. (all other drug classes)    Go Sex-Intimacy & Heart Disease, Get SMART - Goal Setting: - Group verbal and written instruction through game format to discuss heart disease and the return to sexual intimacy. Provides group verbal and written material to discuss and apply goal setting through the application of the S.M.A.R.T. Method.   Other Matters of the Heart: - Provides group verbal, written materials and models to describe Stable Angina and Peripheral Artery. Includes description of the disease process and treatment options available to the cardiac patient.   Infection Prevention: - Provides verbal and written material to individual with discussion of infection control including proper hand washing and proper equipment cleaning during exercise session.   Cardiac Rehab  from 04/30/2020 in [facility] Cardiac and Pulmonary Rehab  Date 03/04/20  Educator AS  Instruction Review Code 1- Verbalizes Understanding      Falls Prevention: - Provides verbal and written material to individual with discussion of falls prevention and safety.   Cardiac Rehab from 04/30/2020 in [facility] Cardiac and Pulmonary Rehab  Date 03/04/20  Educator AS  Instruction Review Code 1- Verbalizes Understanding      Other: -Provides group and verbal instruction on various topics (see comments)   Knowledge Questionnaire Score:  Knowledge Questionnaire Score - 03/04/20 1518      Knowledge Questionnaire Score   Pre Score 22/26           Core Components/Risk Factors/Patient Goals at Admission:  Personal Goals and Risk Factors at Admission - 03/04/20 1522      Core Components/Risk Factors/Patient Goals on Admission    Weight Management Yes    Intervention Weight Management: Develop a combined nutrition and exercise program designed to reach desired caloric intake, while maintaining appropriate intake of nutrient and fiber, sodium and fats, and appropriate energy expenditure required for the weight goal.;Weight Management: Provide education and appropriate resources to help participant work on and attain dietary goals.    Admit Weight 197 lb 9.6 oz (89.6 kg)    Goal Weight: Short Term 195 lb (88.5 kg)    Goal Weight: Long Term 190 lb (86.2 kg)    Expected Outcomes Short Term: Continue to assess and modify interventions until short term weight is achieved;Weight Maintenance: Understanding of the daily nutrition guidelines, which includes 25-35% calories from fat, 7% or less cal from saturated fats, less than 234m cholesterol, less than 1.5gm of sodium, & 5 or more servings of fruits and vegetables daily    Diabetes Yes    Intervention Provide education about signs/symptoms and action to take for hypo/hyperglycemia.;Provide education about proper nutrition, including hydration, and  aerobic/resistive exercise prescription along with prescribed medications to achieve blood glucose in normal ranges: Fasting glucose 65-99 mg/dL    Expected Outcomes Short Term: Participant verbalizes understanding of the signs/symptoms and immediate care of hyper/hypoglycemia, proper foot care and importance of medication, aerobic/resistive exercise and nutrition plan for blood glucose control.;Long  Term: Attainment of HbA1C < 7%.    Hypertension Yes    Intervention Provide education on lifestyle modifcations including regular physical activity/exercise, weight management, moderate sodium restriction and increased consumption of fresh fruit, vegetables, and low fat dairy, alcohol moderation, and smoking cessation.;Monitor prescription use compliance.    Expected Outcomes Short Term: Continued assessment and intervention until BP is < 140/38m HG in hypertensive participants. < 130/862mHG in hypertensive participants with diabetes, heart failure or chronic kidney disease.;Long Term: Maintenance of blood pressure at goal levels.    Lipids Yes    Intervention Provide education and support for participant on nutrition & aerobic/resistive exercise along with prescribed medications to achieve LDL <7048mHDL >37m71m  Expected Outcomes Short Term: Participant states understanding of desired cholesterol values and is compliant with medications prescribed. Participant is following exercise prescription and nutrition guidelines.;Long Term: Cholesterol controlled with medications as prescribed, with individualized exercise RX and with personalized nutrition plan. Value goals: LDL < 70mg18mL > 40 mg.           Education:Diabetes - Individual verbal and written instruction to review signs/symptoms of diabetes, desired ranges of glucose level fasting, after meals and with exercise. Acknowledge that pre and post exercise glucose checks will be done for 3 sessions at entry of program.   Education: Know Your  Numbers and Risk Factors: -Group verbal and written instruction about important numbers in your health.  Discussion of what are risk factors and how they play a role in the disease process.  Review of Cholesterol, Blood Pressure, Diabetes, and BMI and the role they play in your overall health.   Core Components/Risk Factors/Patient Goals Review:   Goals and Risk Factor Review    Row Name 03/27/20 1556 04/10/20 1602 05/26/20 1640         Core Components/Risk Factors/Patient Goals Review   Personal Goals Review Weight Management/Obesity;Hypertension;Lipids Weight Management/Obesity;Hypertension;Diabetes;Improve shortness of breath with ADL's Weight Management/Obesity;Hypertension;Diabetes;Improve shortness of breath with ADL's     Review Tim iOctavia Bruckneroing well and his weight is staying steady. His blood pressures are doing well in class and at home.  He checks them routinely.  Overall, things are improving. Spoke to patient about their shortness of breath and what they can do to improve. Patient has been informed of breathing techniques when starting the program. Patient is informed to tell staff if they have had any med changes and that certain meds they are taking or not taking can be causing shortness of breath. Tim iOctavia Bruckneroing well in rehab. His breathing continues to be his biggest concern.  He has not really noticed much improvement yet, but he is also not doing anything at home and we talked about adding in exercise.  His weight has been steady and blood pressures have been good.  He has not been checking his blood sugars but has not felt any major swings.     Expected Outcomes Short: Continue to work on weight loss Long; Continue to monitor risk factors. Short: Attend [facility] regularly to improve shortness of breath with ADL's. Long: maintain independence with ADL's Short: Add in exercise at home Long: Continue to work on weight loss            Core Components/Risk Factors/Patient Goals at  Discharge (Final Review):   Goals and Risk Factor Review - 05/26/20 1640      Core Components/Risk Factors/Patient Goals Review   Personal Goals Review Weight Management/Obesity;Hypertension;Diabetes;Improve shortness of breath with ADL's  Review Brent Alvarado is doing well in rehab. His breathing continues to be his biggest concern.  He has not really noticed much improvement yet, but he is also not doing anything at home and we talked about adding in exercise.  His weight has been steady and blood pressures have been good.  He has not been checking his blood sugars but has not felt any major swings.    Expected Outcomes Short: Add in exercise at home Long: Continue to work on weight loss           ITP Comments:  ITP Comments    Row Name 03/03/20 1425 03/04/20 1529 03/05/20 0847 03/05/20 1523 04/02/20 0611   ITP Comments Completed virtual orientation today.  EP evaluation is scheduled for Tuesday 5/18  at 2pm.  Documentation for diagnosis can be found in [facility] encounter 02/11/20. Completed 6MWT and gym orientation.  Initial ITP created and sent for review to Dr. Emily Filbert, Medical Director. 30 Day review completed. Medical Director review done, changes made as directed,and approval shown by signature of Market researcher.  New to program First full day of exercise!  Patient was oriented to gym and equipment including functions, settings, policies, and procedures.  Patient's individual exercise prescription and treatment plan were reviewed.  All starting workloads were established based on the results of the 6 minute walk test done at initial orientation visit.  The plan for exercise progression was also introduced and progression will be customized based on patient's performance and goals. 30 Day review completed. Medical Director ITP review done, changes made as directed, and signed approval by Medical Director.   [place] Name 04/30/20 0908 05/28/20 0625         ITP Comments 30 Day review completed. Medical  Director ITP review done, changes made as directed, and signed approval by Medical Director. 30 Day review completed. Medical Director ITP review done, changes made as directed, and signed approval by Medical Director.             Comments:

## 2020-05-28 NOTE — Progress Notes (Signed)
Daily Session Note  Patient Details  Name: GRABIEL SCHMUTZ MRN: [id number] Date of Birth: 1946/02/17 Referring Provider:     Cardiac Rehab from 03/04/2020 in [facility] Cardiac and Pulmonary Rehab  Referring Provider Neoma Laming      Encounter Date: 05/28/2020  Check In:  Session Check In - 05/28/20 1611      Check-In   Supervising physician immediately available to respond to emergencies See telemetry face sheet for immediately available ER MD    Location [facility]-Cardiac & Pulmonary Rehab    Staff Present Renita Papa, RN BSN;Joseph Lou [place], [place] Exercise Physiologist    Virtual Visit No    Medication changes reported     No    Fall or balance concerns reported    No    Warm-up and Cool-down Performed on first and last piece of equipment    Resistance Training Performed Yes    VAD Patient? No    PAD/SET Patient? No      Pain Assessment   Currently in Pain? No/denies              Social History   Tobacco Use  Smoking Status Never Smoker  Smokeless Tobacco Never Used    Goals Met:  Independence with exercise equipment Exercise tolerated well No report of cardiac concerns or symptoms Strength training completed today  Goals Unmet:  Not Applicable  Comments: Pt able to follow exercise prescription today without complaint.  Will continue to monitor for progression.    Dr. Emily Filbert is Medical Director for [place] and [facility].

## 2020-05-29 ENCOUNTER — Encounter: Payer: Medicare Other | Admitting: *Deleted

## 2020-05-29 ENCOUNTER — Other Ambulatory Visit: Payer: Self-pay

## 2020-05-29 VITALS — Ht 71.5 in | Wt 200.0 lb

## 2020-05-29 DIAGNOSIS — Z955 Presence of coronary angioplasty implant and graft: Secondary | ICD-10-CM | POA: Diagnosis not present

## 2020-05-29 NOTE — Progress Notes (Signed)
Daily Session Note  Patient Details  Name: Brent Alvarado MRN: [id number] Date of Birth: 1946/08/04 Referring Provider:     Cardiac Rehab from 03/04/2020 in [facility] Cardiac and Pulmonary Rehab  Referring Provider Neoma Laming      Encounter Date: 05/29/2020  Check In:  Session Check In - 05/29/20 1603      Check-In   Supervising physician immediately available to respond to emergencies See telemetry face sheet for immediately available ER MD    Location [facility]-Cardiac & Pulmonary Rehab    Staff Present Renita Papa, RN BSN;Joseph [address] [place], [place], [place], CCRP, [place], [place], ACSM CEP, Exercise Physiologist    Virtual Visit No    Medication changes reported     No    Fall or balance concerns reported    No    Warm-up and Cool-down Performed on first and last piece of equipment    Resistance Training Performed Yes    VAD Patient? No    PAD/SET Patient? No      Pain Assessment   Currently in Pain? No/denies              Social History   Tobacco Use  Smoking Status Never Smoker  Smokeless Tobacco Never Used    Goals Met:  Independence with exercise equipment Exercise tolerated well No report of cardiac concerns or symptoms Strength training completed today  Goals Unmet:  Not Applicable  Comments: Pt able to follow exercise prescription today without complaint.  Will continue to monitor for progression.   [place] Name 03/04/20 1512 05/29/20 1612       6 Minute Walk   Phase Initial Discharge    Distance 1098 feet 1300 feet    Distance % Change -- 18.4 %    Distance Feet Change -- 202 ft    Walk Time 6 minutes 6 minutes    # of Rest Breaks 0 0    MPH 2.08 2.46    METS 2.13 2.64    RPE 13 11    Perceived Dyspnea  2 --    VO2 Peak 7.45 9.23    Symptoms Yes (comment) Yes (comment)    Comments hip pain ( been for 30 years) hip pain 4/10    Resting HR 65 bpm 66 bpm    Resting BP 108/64 136/62    Resting Oxygen  Saturation  97 % --    Exercise Oxygen Saturation  during 6 min walk 98 % --    Max Ex. HR 90 bpm 89 bpm    Max Ex. BP 110/54 138/64    2 Minute Post BP 110/64 --             Dr. Emily Filbert is Medical Director for [place] and [facility].

## 2020-06-04 ENCOUNTER — Encounter: Payer: Medicare Other | Admitting: *Deleted

## 2020-06-04 ENCOUNTER — Other Ambulatory Visit: Payer: Self-pay

## 2020-06-04 DIAGNOSIS — Z955 Presence of coronary angioplasty implant and graft: Secondary | ICD-10-CM | POA: Diagnosis not present

## 2020-06-04 NOTE — Progress Notes (Signed)
Daily Session Note  Patient Details  Name: Brent Alvarado MRN: [id number] Date of Birth: 11/09/1945 Referring Provider:     Cardiac Rehab from 03/04/2020 in [facility] Cardiac and Pulmonary Rehab  Referring Provider Neoma Laming      Encounter Date: 06/04/2020  Check In:  Session Check In - 06/04/20 1801      Check-In   Supervising physician immediately available to respond to emergencies See telemetry face sheet for immediately available ER MD    Location [facility]-Cardiac & Pulmonary Rehab    Staff Present Nyoka Cowden, RN, BSN, MA;Joseph Hood [place], [place] Exercise Physiologist    Virtual Visit No    Medication changes reported     No    Fall or balance concerns reported    No    Warm-up and Cool-down Performed on first and last piece of equipment    Resistance Training Performed No    VAD Patient? No    PAD/SET Patient? No      Pain Assessment   Currently in Pain? No/denies              Social History   Tobacco Use  Smoking Status Never Smoker  Smokeless Tobacco Never Used    Goals Met:  Independence with exercise equipment Exercise tolerated well No report of cardiac concerns or symptoms Strength training completed today  Goals Unmet:  Not Applicable  Comments: Pt able to follow exercise prescription today without complaint.  Will continue to monitor for progression.   Dr. Emily Filbert is Medical Director for [place] and [facility].

## 2020-06-05 ENCOUNTER — Encounter: Payer: Medicare Other | Admitting: *Deleted

## 2020-06-05 ENCOUNTER — Other Ambulatory Visit: Payer: Self-pay

## 2020-06-05 DIAGNOSIS — Z955 Presence of coronary angioplasty implant and graft: Secondary | ICD-10-CM

## 2020-06-05 NOTE — Progress Notes (Signed)
Daily Session Note  Patient Details  Name: Brent Alvarado MRN: [id number] Date of Birth: [birth year] Referring Provider:     Cardiac Rehab from 03/04/2020 in [facility] Cardiac and Pulmonary Rehab  Referring Provider Neoma Laming      Encounter Date: 06/05/2020  Check In:  Session Check In - 06/05/20 1620      Check-In   Supervising physician immediately available to respond to emergencies See telemetry face sheet for immediately available ER MD    Location [facility]-Cardiac & Pulmonary Rehab    Staff Present Nyoka Cowden, RN, BSN, MA;Joseph Hood RCP,RRT,BSRT;Kelly Amedeo Plenty, BS, ACSM CEP, Exercise Physiologist;Amanda Oletta Darter, [place], ACSM CEP, Exercise Physiologist    Virtual Visit No    Medication changes reported     No    Fall or balance concerns reported    No    Warm-up and Cool-down Performed on first and last piece of equipment    Resistance Training Performed Yes    VAD Patient? No    PAD/SET Patient? No              Social History   Tobacco Use  Smoking Status Never Smoker  Smokeless Tobacco Never Used    Goals Met:  Independence with exercise equipment Exercise tolerated well No report of cardiac concerns or symptoms Strength training completed today  Goals Unmet:  Not Applicable  Comments: **Pt able to follow exercise prescription today without complaint.  Will continue to monitor for progression.   Dr. Emily Filbert is Medical Director for [place] and [facility].

## 2020-06-09 ENCOUNTER — Other Ambulatory Visit: Payer: Self-pay

## 2020-06-09 ENCOUNTER — Encounter: Payer: Medicare Other | Admitting: *Deleted

## 2020-06-09 DIAGNOSIS — Z955 Presence of coronary angioplasty implant and graft: Secondary | ICD-10-CM

## 2020-06-09 NOTE — Progress Notes (Signed)
Daily Session Note  Patient Details  Name: Brent Alvarado MRN: [id number] Date of Birth: 03/18/1946 Referring Provider:     Cardiac Rehab from 03/04/2020 in [facility]  Referring Provider Khan, shaukat      Encounter Date: 06/09/2020  Check In:  Session Check In - 06/09/20 1647      Check-In   Supervising physician immediately available to respond to emergencies See telemetry face sheet for immediately available ER MD    Location [facility]    Staff Present Susanne Bice, RN, BSN, CCRP;Jessica Hawkins, MA, RCEP, CCRP, CCET;Amanda Sommer, BA, ACSM CEP, Exercise Physiologist;Kelly Hayes, BS, ACSM CEP, Exercise Physiologist    Virtual Visit No    Medication changes reported     No    Fall or balance concerns reported    No    Warm-up and Cool-down Performed on first and last piece of equipment    Resistance Training Performed Yes    VAD Patient? No    PAD/SET Patient? No      Pain Assessment   Currently in Pain? No/denies              Social History   Tobacco Use  Smoking Status Never Smoker  Smokeless Tobacco Never Used    Goals Met:  Independence with exercise equipment Exercise tolerated well No report of cardiac concerns or symptoms  Goals Unmet:  Not Applicable  Comments: Pt able to follow exercise prescription today without complaint.  Will continue to monitor for progression.    Dr. Mark Miller is Medical Director for [facility] and [facility]. 

## 2020-06-11 ENCOUNTER — Encounter: Payer: Medicare Other | Admitting: *Deleted

## 2020-06-11 ENCOUNTER — Other Ambulatory Visit: Payer: Self-pay

## 2020-06-11 DIAGNOSIS — Z955 Presence of coronary angioplasty implant and graft: Secondary | ICD-10-CM

## 2020-06-11 NOTE — Patient Instructions (Signed)
Discharge Patient Instructions  Patient Details  Name: Brent Alvarado MRN: [id number] Date of Birth: [birth year] Referring Provider:  Dionisio David, MD   Number of Visits: 67  Reason for Discharge:  Patient reached a stable level of exercise. Patient independent in their exercise. Patient has met program and personal goals.  Smoking History:  Social History   Tobacco Use  Smoking Status Never Smoker  Smokeless Tobacco Never Used    Diagnosis:  Status post coronary artery stent placement  Initial Exercise Prescription:  Initial Exercise Prescription - 03/04/20 1500      Date of Initial Exercise RX and Referring Provider   Date 03/04/20    Referring Provider Humphrey Rolls, shaukat      Treadmill   MPH 1.5    Grade 0    Minutes 15    METs 2.15      Recumbant Bike   Level 1    RPM 60    Minutes 15    METs 2.13      NuStep   Level 2    SPM 80    Minutes 15    METs 2.13      REL-XR   Level 2    Speed 50    Minutes 15    METs 2.13      Prescription Details   Frequency (times per week) 3    Duration Progress to 30 minutes of continuous aerobic without signs/symptoms of physical distress      Intensity   THRR 40-80% of Max Heartrate 97-129    Ratings of Perceived Exertion 11-15    Perceived Dyspnea 0-4      Resistance Training   Training Prescription Yes    Weight 3 lb    Reps 10-15           Discharge Exercise Prescription (Final Exercise Prescription Changes):  Exercise Prescription Changes - 06/04/20 1500      Response to Exercise   Blood Pressure (Admit) 136/62    Blood Pressure (Exercise) 138/64    Blood Pressure (Exit) 124/70    Heart Rate (Admit) 66 bpm    Heart Rate (Exercise) 89 bpm    Heart Rate (Exit) 69 bpm    Rating of Perceived Exertion (Exercise) 11    Symptoms none    Duration Continue with 30 min of aerobic exercise without signs/symptoms of physical distress.    Intensity THRR unchanged      Progression   Progression Continue  to progress workloads to maintain intensity without signs/symptoms of physical distress.    Average METs 2.47      Resistance Training   Training Prescription Yes    Weight 4 lb    Reps 10-15      Interval Training   Interval Training No      Treadmill   MPH 1.7    Grade 0    Minutes 15    METs 2.3      Recumbant Bike   Level 3    Minutes 15      NuStep   Level 4    Minutes 15    METs 3.2      T5 Nustep   Level 2    Minutes 15    METs 1.9      Home Exercise Plan   Plans to continue exercise at Home (comment)   walking and staff videos   Frequency Add 2 additional days to program exercise sessions.    Initial Home Exercises Provided  03/27/20           Functional Capacity:  6 Minute Walk    Row Name 03/04/20 1512 05/29/20 1612       6 Minute Walk   Phase Initial Discharge    Distance 1098 feet 1300 feet    Distance % Change -- 18.4 %    Distance Feet Change -- 202 ft    Walk Time 6 minutes 6 minutes    # of Rest Breaks 0 0    MPH 2.08 2.46    METS 2.13 2.64    RPE 13 11    Perceived Dyspnea  2 --    VO2 Peak 7.45 9.23    Symptoms Yes (comment) Yes (comment)    Comments hip pain ( been for 30 years) hip pain 4/10    Resting HR 65 bpm 66 bpm    Resting BP 108/64 136/62    Resting Oxygen Saturation  97 % --    Exercise Oxygen Saturation  during 6 min walk 98 % --    Max Ex. HR 90 bpm 89 bpm    Max Ex. BP 110/54 138/64    2 Minute Post BP 110/64 --           Quality of Life:  Quality of Life - 03/04/20 1518      Quality of Life   Select Quality of Life      Quality of Life Scores   Health/Function Pre 20.2 %    Socioeconomic Pre 21.07 %    Psych/Spiritual Pre 24.43 %    Family Pre 22.8 %    GLOBAL Pre 21.63 %           Personal Goals: Goals established at orientation with interventions provided to work toward goal.  Personal Goals and Risk Factors at Admission - 03/04/20 1522      Core Components/Risk Factors/Patient Goals on  Admission    Weight Management Yes    Intervention Weight Management: Develop a combined nutrition and exercise program designed to reach desired caloric intake, while maintaining appropriate intake of nutrient and fiber, sodium and fats, and appropriate energy expenditure required for the weight goal.;Weight Management: Provide education and appropriate resources to help participant work on and attain dietary goals.    Admit Weight 197 lb 9.6 oz (89.6 kg)    Goal Weight: Short Term 195 lb (88.5 kg)    Goal Weight: Long Term 190 lb (86.2 kg)    Expected Outcomes Short Term: Continue to assess and modify interventions until short term weight is achieved;Weight Maintenance: Understanding of the daily nutrition guidelines, which includes 25-35% calories from fat, 7% or less cal from saturated fats, less than 279m cholesterol, less than 1.5gm of sodium, & 5 or more servings of fruits and vegetables daily    Diabetes Yes    Intervention Provide education about signs/symptoms and action to take for hypo/hyperglycemia.;Provide education about proper nutrition, including hydration, and aerobic/resistive exercise prescription along with prescribed medications to achieve blood glucose in normal ranges: Fasting glucose 65-99 mg/dL    Expected Outcomes Short Term: Participant verbalizes understanding of the signs/symptoms and immediate care of hyper/hypoglycemia, proper foot care and importance of medication, aerobic/resistive exercise and nutrition plan for blood glucose control.;Long Term: Attainment of HbA1C < 7%.    Hypertension Yes    Intervention Provide education on lifestyle modifcations including regular physical activity/exercise, weight management, moderate sodium restriction and increased consumption of fresh fruit, vegetables, and low fat dairy, alcohol moderation,  and smoking cessation.;Monitor prescription use compliance.    Expected Outcomes Short Term: Continued assessment and intervention until BP  is < 140/52m HG in hypertensive participants. < 130/851mHG in hypertensive participants with diabetes, heart failure or chronic kidney disease.;Long Term: Maintenance of blood pressure at goal levels.    Lipids Yes    Intervention Provide education and support for participant on nutrition & aerobic/resistive exercise along with prescribed medications to achieve LDL <7031mHDL >87m23m  Expected Outcomes Short Term: Participant states understanding of desired cholesterol values and is compliant with medications prescribed. Participant is following exercise prescription and nutrition guidelines.;Long Term: Cholesterol controlled with medications as prescribed, with individualized exercise RX and with personalized nutrition plan. Value goals: LDL < 70mg58mL > 40 mg.            Personal Goals Discharge:  Goals and Risk Factor Review - 05/26/20 1640      Core Components/Risk Factors/Patient Goals Review   Personal Goals Review Weight Management/Obesity;Hypertension;Diabetes;Improve shortness of breath with ADL's    Review Brent Alvarado iOctavia Bruckneroing well in rehab. His breathing continues to be his biggest concern.  He has not really noticed much improvement yet, but he is also not doing anything at home and we talked about adding in exercise.  His weight has been steady and blood pressures have been good.  He has not been checking his blood sugars but has not felt any major swings.    Expected Outcomes Short: Add in exercise at home Long: Continue to work on weight loss           Exercise Goals and Review:  Exercise Goals    Row Name 03/04/20 1516             Exercise Goals   Increase Physical Activity Yes       Intervention Provide advice, education, support and counseling about physical activity/exercise needs.;Develop an individualized exercise prescription for aerobic and resistive training based on initial evaluation findings, risk stratification, comorbidities and participant's personal goals.        Expected Outcomes Short Term: Attend rehab on a regular basis to increase amount of physical activity.;Long Term: Add in home exercise to make exercise part of routine and to increase amount of physical activity.;Long Term: Exercising regularly at least 3-5 days a week.       Increase Strength and Stamina Yes       Intervention Provide advice, education, support and counseling about physical activity/exercise needs.;Develop an individualized exercise prescription for aerobic and resistive training based on initial evaluation findings, risk stratification, comorbidities and participant's personal goals.       Expected Outcomes Short Term: Increase workloads from initial exercise prescription for resistance, speed, and METs.;Short Term: Perform resistance training exercises routinely during rehab and add in resistance training at home;Long Term: Improve cardiorespiratory fitness, muscular endurance and strength as measured by increased METs and functional capacity (6MWT)       Able to understand and use rate of perceived exertion (RPE) scale Yes       Intervention Provide education and explanation on how to use RPE scale       Expected Outcomes Short Term: Able to use RPE daily in rehab to express subjective intensity level;Long Term:  Able to use RPE to guide intensity level when exercising independently       Able to understand and use Dyspnea scale Yes       Intervention Provide education and explanation on how to use Dyspnea  scale       Expected Outcomes Short Term: Able to use Dyspnea scale daily in rehab to express subjective sense of shortness of breath during exertion;Long Term: Able to use Dyspnea scale to guide intensity level when exercising independently       Knowledge and understanding of Target Heart Rate Range (THRR) Yes       Intervention Provide education and explanation of THRR including how the numbers were predicted and where they are located for reference       Expected Outcomes  Short Term: Able to state/look up THRR;Short Term: Able to use daily as guideline for intensity in rehab;Long Term: Able to use THRR to govern intensity when exercising independently       Able to check pulse independently Yes       Intervention Provide education and demonstration on how to check pulse in carotid and radial arteries.;Review the importance of being able to check your own pulse for safety during independent exercise       Expected Outcomes Short Term: Able to explain why pulse checking is important during independent exercise;Long Term: Able to check pulse independently and accurately       Understanding of Exercise Prescription Yes       Intervention Provide education, explanation, and written materials on patient's individual exercise prescription       Expected Outcomes Short Term: Able to explain program exercise prescription;Long Term: Able to explain home exercise prescription to exercise independently              Exercise Goals Re-Evaluation:  Exercise Goals Re-Evaluation    Row Name 03/05/20 1523 03/13/20 1556 03/27/20 1553 04/07/20 1623 04/10/20 1607     Exercise Goal Re-Evaluation   Exercise Goals Review Increase Physical Activity;Knowledge and understanding of Target Heart Rate Range (THRR);Able to understand and use rate of perceived exertion (RPE) scale;Understanding of Exercise Prescription;Increase Strength and Stamina;Able to check pulse independently Increase Physical Activity;Increase Strength and Stamina;Understanding of Exercise Prescription Increase Physical Activity;Increase Strength and Stamina;Understanding of Exercise Prescription Increase Physical Activity;Increase Strength and Stamina;Understanding of Exercise Prescription Increase Strength and Stamina;Increase Physical Activity   Comments Reviewed RPE and dyspnea scales, THR and program prescription with pt today.  Pt voiced understanding and was given a copy of goals to take home. Brent Alvarado is off to a good  start in rehab.  He has taken to the exercise very well with his frist four days of exercise.  We will continue to monitor her progress. Brent Alvarado is doing well in rehab.  He is starting to notice that his strength and stamina are starting to recover.  He has also noticed that his breathing is getting better as it is easier to do things.  Reviewed home exercise with pt today.  Pt plans to walk and staff videos at home for exercise.  Reviewed THR, pulse, RPE, sign and symptoms, pulse oximetery and when to call 911 or MD.  Also discussed weather considerations and indoor options.  Pt voiced understanding. Brent Alvarado continues to do well in rehab.  He is up to 16 watts on the bike.  We will continue to montior his progress. Patient states that he is not doing any exercise at home. He does not have the motivation to workout at home. Talked to patient why it is important to exercise at home. He states that he is able to monitor his heart rate at home. He is onboard with trying to walk a few days a week outside of rehab.  Expected Outcomes Short: Use RPE daily to regulate intensity. Long: Follow program prescription in THR. Short: Continue to attend regularly Long: Continue to follow program prescription. Short: Start to add in more exercise at home. Long: Continue to improve stamina. Short: Increase treadmill Long: Continue to improve stamina. Short: add two days of walking at home. Long: add a routine of home exercise daily after [place] Name 04/22/20 1424 05/05/20 1525 05/21/20 1338 05/26/20 1643 06/04/20 1509     Exercise Goal Re-Evaluation   Exercise Goals Review Increase Strength and Stamina;Increase Physical Activity;Understanding of Exercise Prescription Increase Strength and Stamina;Increase Physical Activity;Understanding of Exercise Prescription Increase Strength and Stamina;Increase Physical Activity;Understanding of Exercise Prescription Increase Strength and Stamina;Increase Physical Activity;Understanding  of Exercise Prescription Increase Strength and Stamina;Increase Physical Activity;Understanding of Exercise Prescription   Comments Brent Alvarado is doing well, he has increased to a 1.7 speed on the treadmill. Will continue to focus on increasing workloads. Brent Alvarado continues to well in rehab.  He is now up to to 2.5 METs on bike.  We will continue to monitor his progress. Brent Alvarado missed a couple weeks due to illness.  He is working on building back up to previous levels. Brent Alvarado is doing well in rehab. He has not been exercising at home as he just doesn't feel like it. We talked about how exercise will help him feel better and improve his breathing. Brent Alvarado is doing well in rehab. However, on Monday, he came 30 min late for class and was not really clear and aware that he had been coming to class.  He had said that he had just been here wondering around and thought he would drop in to say hello.  We will continue to keep a close eye on him and his memory.  He did improve his post 6MWT by over 200 ft!!  We will continue to monitor his progress.   Expected Outcomes Short: Continue attending rehab regularly Long: Increase MET level and stamina/ endurance Short: Increase treadmill back up and increase T5 NuStep.  Long; Continue to improve stamina. Short: get back to regular attendance Long:  work up to previous intensity Short: Add in exercise at home.  Long: Continue to improve stamina. Short: Set up routine at home Long: Continue to exercise routinely.          Nutrition & Weight - Outcomes:  Pre Biometrics - 03/04/20 1517      Pre Biometrics   Height 5' 11.5" (1.816 m)    Weight 197 lb 9.6 oz (89.6 kg)    BMI (Calculated) 27.18    Single Leg Stand 7.75 seconds           Post Biometrics - 05/29/20 1614       Post  Biometrics   Height 5' 11.5" (1.816 m)    Weight 200 lb (90.7 kg)    BMI (Calculated) 27.51    Single Leg Stand 5.37 seconds           Nutrition:  Nutrition Therapy & Goals - 03/31/20 1604       Nutrition Therapy   Diet heart healthy, low Na, diabetes friendly    Protein (specify units) 70-75g    Fiber 30 grams    Whole Grain Foods 3 servings    Saturated Fats 12 max. grams    Fruits and Vegetables 5 servings/day    Sodium 1.5 grams      Personal Nutrition Goals   Nutrition Goal ST:LT: no goals at this time  Comments A1C: 6. grape nuts, sandwich for lunch [place] or chicken salad, wife fixes dinner - mostly protein (bologna, hamburger, etc.). Discussed heart healthy eating and diabetes friendly eating. Will f/u and include wife for re-evaluation.      Intervention Plan   Intervention Prescribe, educate and counsel regarding individualized specific dietary modifications aiming towards targeted core components such as weight, hypertension, lipid management, diabetes, heart failure and other comorbidities.;Nutrition handout(s) given to patient.    Expected Outcomes Short Term Goal: Understand basic principles of dietary content, such as calories, fat, sodium, cholesterol and nutrients.;Short Term Goal: A plan has been developed with personal nutrition goals set during dietitian appointment.;Long Term Goal: Adherence to prescribed nutrition plan.           Nutrition Discharge:  Nutrition Assessments - 03/05/20 0919      MEDFICTS Scores   Pre Score 86           Education Questionnaire Score:  Knowledge Questionnaire Score - 03/04/20 1518      Knowledge Questionnaire Score   Pre Score 22/26           Goals reviewed with patient; copy given to patient.

## 2020-06-11 NOTE — Progress Notes (Signed)
Daily Session Note  Patient Details  Name: Brent Alvarado MRN: [id number] Date of Birth: September 27, 1946 Referring Provider:     Cardiac Rehab from 03/04/2020 in [facility]  Referring Provider Neoma Laming      Encounter Date: 06/11/2020  Check In:  Session Check In - 06/11/20 1618      Check-In   Supervising physician immediately available to respond to emergencies See telemetry face sheet for immediately available ER MD    Location [facility]    Staff Present Renita Papa, RN BSN;Melissa Caiola RDN, Rowe Pavy, BA, ACSM CEP, Exercise Physiologist    Virtual Visit No    Medication changes reported     No    Fall or balance concerns reported    No    Warm-up and Cool-down Performed on first and last piece of equipment    Resistance Training Performed Yes    VAD Patient? No    PAD/SET Patient? No      Pain Assessment   Currently in Pain? No/denies              Social History   Tobacco Use  Smoking Status Never Smoker  Smokeless Tobacco Never Used    Goals Met:  Independence with exercise equipment Exercise tolerated well No report of cardiac concerns or symptoms Strength training completed today  Goals Unmet:  Not Applicable  Comments:  [place] graduated today from  rehab with 36 sessions completed.  Details of the patient's exercise prescription and what He needs to do in order to continue the prescription and progress were discussed with patient.  Patient was given a copy of prescription and goals.  Patient verbalized understanding.  Tullio plans to continue to exercise by walking at home.    Dr. Emily Filbert is Medical Director for [place] and [facility].

## 2020-06-11 NOTE — Progress Notes (Signed)
Cardiac Individual Treatment Plan  Patient Details  Name: Brent Alvarado MRN: [id number] Date of Birth: 06-22-1946 Referring Provider:     Cardiac Rehab from 03/04/2020 in [facility] Cardiac and Pulmonary Rehab  Referring Provider Humphrey Rolls, [place]      Initial Encounter Date:    Cardiac Rehab from 03/04/2020 in [facility] Cardiac and Pulmonary Rehab  Date 03/04/20      Visit Diagnosis: Status post coronary artery stent placement  Patient's Home Medications on Admission:  Current Outpatient Medications:  .  amLODipine (NORVASC) 10 MG tablet, Take 10 mg by mouth daily., Disp: , Rfl:  .  aspirin 81 MG chewable tablet, Chew 1 tablet (81 mg total) by mouth daily. (Patient not taking: Reported on 03/03/2020), Disp: 30 tablet, Rfl: 0 .  baclofen (LIORESAL) 10 MG tablet, Take 10 mg by mouth 3 (three) times daily., Disp: , Rfl:  .  clopidogrel (PLAVIX) 75 MG tablet, Take 1 tablet (75 mg total) by mouth daily with breakfast., Disp: 30 tablet, Rfl: 0 .  Dulaglutide (TRULICITY) 1.63 AG/5.3MI SOPN, Inject into the skin once a week., Disp: , Rfl:  .  fenofibrate 54 MG tablet, Take 54 mg by mouth daily., Disp: , Rfl:  .  furosemide (LASIX) 20 MG tablet, Take 20 mg by mouth daily. , Disp: , Rfl:  .  glimepiride (AMARYL) 4 MG tablet, Take 1 tablet by mouth daily., Disp: , Rfl:  .  losartan (COZAAR) 50 MG tablet, Take 50 mg by mouth every morning., Disp: , Rfl:  .  nitroGLYCERIN (NITROSTAT) 0.4 MG SL tablet, Place 1 tablet (0.4 mg total) under the tongue every 5 (five) minutes x 3 doses as needed for chest pain., Disp: 20 tablet, Rfl: 1 .  ondansetron (ZOFRAN) 4 MG tablet, TAKE 1 TABLET BY MOUTH 3 TIMES DAILY AS NEEDED FOR NAUSEA FOR UP TO 7 DAYS, Disp: , Rfl:  .  pantoprazole (PROTONIX) 40 MG tablet, Take 1 tablet by mouth 2 (two) times daily., Disp: , Rfl:  .  PARoxetine (PAXIL) 40 MG tablet, Take 40 mg by mouth every morning., Disp: , Rfl:  .  polyethylene glycol (MIRALAX / GLYCOLAX) 17 g packet, Take 17 g by  mouth daily., Disp: , Rfl:  .  rosuvastatin (CRESTOR) 40 MG tablet, Take 1 tablet by mouth daily., Disp: , Rfl:  .  sotalol (BETAPACE) 80 MG tablet, Take 80 mg by mouth 2 (two) times daily., Disp: , Rfl:  .  tamsulosin (FLOMAX) 0.4 MG CAPS capsule, Take 0.4 mg by mouth., Disp: , Rfl:  .  XARELTO 15 MG TABS tablet, Take 15 mg by mouth daily., Disp: , Rfl:  No current facility-administered medications for this visit.  Facility-Administered Medications Ordered in Other Visits:  .  0.9 %  sodium chloride infusion, 250 mL, Intravenous, PRN, Neoma Laming A, MD .  sodium chloride flush (NS) 0.9 % injection 3 mL, 3 mL, Intravenous, Q12H, Neoma Laming A, MD .  sodium chloride flush (NS) 0.9 % injection 3 mL, 3 mL, Intravenous, PRN, Neoma Laming A, MD .  sodium chloride flush (NS) 0.9 % injection 3 mL, 3 mL, Intravenous, Q12H, Dionisio David, MD  Past Medical History: Past Medical History:  Diagnosis Date  . Anxiety   . BPH (benign prostatic hyperplasia)   . Depression   . Diabetes mellitus without complication ([place])    Controlled poorly with metformin  . Diverticulitis   . Dyspnea   . Dysrhythmia   . GERD (gastroesophageal reflux disease)   .  Hyperlipidemia   . Hypertension     Tobacco Use: Social History   Tobacco Use  Smoking Status Never Smoker  Smokeless Tobacco Never Used    Labs: Recent Review Flowsheet Data    Labs for ITP Cardiac and Pulmonary Rehab Latest Ref Rng & Units 02/11/2020   Hemoglobin A1c 4.8 - 5.6 % 6.5(H)       Exercise Target Goals: Exercise Program Goal: Individual exercise prescription set using results from initial 6 min walk test and THRR while considering  patient's activity barriers and safety.   Exercise Prescription Goal: Initial exercise prescription builds to 30-45 minutes a day of aerobic activity, 2-3 days per week.  Home exercise guidelines will be given to patient during program as part of exercise prescription that the participant will  acknowledge.   Education: Aerobic Exercise & Resistance Training: - Gives group verbal and written instruction on the various components of exercise. Focuses on aerobic and resistive training programs and the benefits of this training and how to safely progress through these programs..   Cardiac Rehab from 06/11/2020 in [facility] Cardiac and Pulmonary Rehab  Date 06/04/20 (P)   Educator AS (P)   Instruction Review Code 1- Verbalizes Understanding (P)       Education: Exercise & Equipment Safety: - Individual verbal instruction and demonstration of equipment use and safety with use of the equipment.   Cardiac Rehab from 05/28/2020 in [facility] Cardiac and Pulmonary Rehab  Date 03/04/20  Educator AS  Instruction Review Code 1- Verbalizes Understanding      Education: Exercise Physiology & General Exercise Guidelines: - Group verbal and written instruction with models to review the exercise physiology of the cardiovascular system and associated critical values. Provides general exercise guidelines with specific guidelines to those with heart or lung disease.    Cardiac Rehab from 05/28/2020 in [facility] Cardiac and Pulmonary Rehab  Date 04/30/20  Educator AS  Instruction Review Code 1- Verbalizes Understanding      Education: Flexibility, Balance, Mind/Body Relaxation: Provides group verbal/written instruction on the benefits of flexibility and balance training, including mind/body exercise modes such as yoga, pilates and tai chi.  Demonstration and skill practice provided.   Activity Barriers & Risk Stratification:  Activity Barriers & Cardiac Risk Stratification - 03/03/20 1405      Activity Barriers & Cardiac Risk Stratification   Activity Barriers Back Problems;Neck/Spine Problems;Arthritis;Deconditioning;Muscular Weakness;Shortness of Breath   chronic low back pain   Cardiac Risk Stratification Moderate           6 Minute Walk:  6 Minute Walk    Row Name 03/04/20 1512 05/29/20 1612         6 Minute Walk   Phase Initial Discharge    Distance 1098 feet 1300 feet    Distance % Change -- 18.4 %    Distance Feet Change -- 202 ft    Walk Time 6 minutes 6 minutes    # of Rest Breaks 0 0    MPH 2.08 2.46    METS 2.13 2.64    RPE 13 11    Perceived Dyspnea  2 --    VO2 Peak 7.45 9.23    Symptoms Yes (comment) Yes (comment)    Comments hip pain ( been for 30 years) hip pain 4/10    Resting HR 65 bpm 66 bpm    Resting BP 108/64 136/62    Resting Oxygen Saturation  97 % --    Exercise Oxygen Saturation  during 6 min  walk 98 % --    Max Ex. HR 90 bpm 89 bpm    Max Ex. BP 110/54 138/64    2 Minute Post BP 110/64 --           Oxygen Initial Assessment:   Oxygen Re-Evaluation:   Oxygen Discharge (Final Oxygen Re-Evaluation):   Initial Exercise Prescription:  Initial Exercise Prescription - 03/04/20 1500      Date of Initial Exercise RX and Referring Provider   Date 03/04/20    Referring Provider Humphrey Rolls, [place]      Treadmill   MPH 1.5    Grade 0    Minutes 15    METs 2.15      Recumbant Bike   Level 1    RPM 60    Minutes 15    METs 2.13      NuStep   Level 2    SPM 80    Minutes 15    METs 2.13      REL-XR   Level 2    Speed 50    Minutes 15    METs 2.13      Prescription Details   Frequency (times per week) 3    Duration Progress to 30 minutes of continuous aerobic without signs/symptoms of physical distress      Intensity   THRR 40-80% of Max Heartrate 97-129    Ratings of Perceived Exertion 11-15    Perceived Dyspnea 0-4      Resistance Training   Training Prescription Yes    Weight 3 lb    Reps 10-15           Perform Capillary Blood Glucose checks as needed.  Exercise Prescription Changes:   Exercise Prescription Changes    Row Name 03/04/20 1500 03/13/20 1500 03/28/20 0800 04/07/20 1600 04/22/20 1400     Response to Exercise   Blood Pressure (Admit) 108/64 116/60 128/74 122/64 122/64   Blood Pressure (Exercise)  110/54 120/64 140/74 120/60 124/68   Blood Pressure (Exit) 110/64 100/50 106/64 102/60 118/68   Heart Rate (Admit) 65 bpm 61 bpm 68 bpm 65 bpm 65 bpm   Heart Rate (Exercise) 90 bpm 86 bpm 104 bpm 73 bpm 99 bpm   Heart Rate (Exit) 75 bpm 77 bpm 73 bpm 69 bpm 77 bpm   Oxygen Saturation (Admit) 97 % -- -- -- --   Oxygen Saturation (Exercise) 98 % -- -- -- --   Rating of Perceived Exertion (Exercise) 13 13 13 12 13    Perceived Dyspnea (Exercise) 2 -- -- -- --   Symptoms hip pain none none none none   Duration -- Progress to 30 minutes of  aerobic without signs/symptoms of physical distress Continue with 30 min of aerobic exercise without signs/symptoms of physical distress. Continue with 30 min of aerobic exercise without signs/symptoms of physical distress. Continue with 30 min of aerobic exercise without signs/symptoms of physical distress.   Intensity -- THRR unchanged THRR unchanged THRR unchanged THRR unchanged     Progression   Progression -- Continue to progress workloads to maintain intensity without signs/symptoms of physical distress. Continue to progress workloads to maintain intensity without signs/symptoms of physical distress. Continue to progress workloads to maintain intensity without signs/symptoms of physical distress. Continue to progress workloads to maintain intensity without signs/symptoms of physical distress.   Average METs -- 2.35 2.08 2.29 2.27     Resistance Training   Training Prescription -- Yes Yes Yes Yes   Weight -- 3  lb 3 lb 3 lb 3 lb   Reps -- 10-15 10-15 10-15 10-15     Interval Training   Interval Training -- No No No No     Treadmill   MPH -- 1.3 1.2 1.5 1.7   Grade -- 0 0.5 0 0   Minutes -- 15 15 15 15    METs -- 2 2 2.15 2.3     Recumbant Bike   Level -- -- 1 3 --   Watts -- -- 16 16 --   Minutes -- -- 15 15 --   METs -- -- 2.15 2.15 --     NuStep   Level -- 2 3 3 3    Minutes -- 15 15 15 15    METs -- 2.7 2.6 2.2 2     REL-XR   Level -- 2 2  2 3    Minutes -- 15 15 15 15    METs -- -- 1.2 -- --     Home Exercise Plan   Plans to continue exercise at -- -- Home (comment)  walking and staff videos Home (comment)  walking and staff videos Home (comment)  walking and staff videos   Frequency -- -- Add 2 additional days to program exercise sessions. Add 2 additional days to program exercise sessions. Add 2 additional days to program exercise sessions.   Initial Home Exercises Provided -- -- 03/27/20 03/27/20 03/27/20   Row Name 05/05/20 1500 05/21/20 1300 06/04/20 1500         Response to Exercise   Blood Pressure (Admit) 110/60 130/64 136/62     Blood Pressure (Exercise) 118/60 148/76 138/64     Blood Pressure (Exit) 114/64 104/70 124/70     Heart Rate (Admit) 67 bpm 73 bpm 66 bpm     Heart Rate (Exercise) 77 bpm 81 bpm 89 bpm     Heart Rate (Exit) 68 bpm 72 bpm 69 bpm     Rating of Perceived Exertion (Exercise) 11 15 11      Symptoms none none none     Duration Continue with 30 min of aerobic exercise without signs/symptoms of physical distress. Continue with 30 min of aerobic exercise without signs/symptoms of physical distress. Continue with 30 min of aerobic exercise without signs/symptoms of physical distress.     Intensity THRR unchanged THRR unchanged THRR unchanged       Progression   Progression Continue to progress workloads to maintain intensity without signs/symptoms of physical distress. Continue to progress workloads to maintain intensity without signs/symptoms of physical distress. Continue to progress workloads to maintain intensity without signs/symptoms of physical distress.     Average METs 2.18 2 2.47       Resistance Training   Training Prescription Yes Yes Yes     Weight 3 lb 3 lb 4 lb     Reps 10-15 10-15 10-15       Interval Training   Interval Training No No No       Treadmill   MPH 1.6 1.3 1.7     Grade 0 0 0     Minutes 15 15 15      METs 2.23 2 2.3       Recumbant Bike   Level 3 -- 3      Minutes 15 -- 15     METs 2.5 -- --       NuStep   Level 4 -- 4     Minutes 15 -- 15     METs 2.1 --  3.2       T5 Nustep   Level 2 -- 2     Minutes 15 -- 15     METs 1.9 -- 1.9       Home Exercise Plan   Plans to continue exercise at Home (comment)  walking and staff videos Home (comment)  walking and staff videos Home (comment)  walking and staff videos     Frequency Add 2 additional days to program exercise sessions. Add 2 additional days to program exercise sessions. Add 2 additional days to program exercise sessions.     Initial Home Exercises Provided 03/27/20 03/27/20 03/27/20            Exercise Comments:   Exercise Goals and Review:   Exercise Goals    Row Name 03/04/20 1516             Exercise Goals   Increase Physical Activity Yes       Intervention Provide advice, education, support and counseling about physical activity/exercise needs.;Develop an individualized exercise prescription for aerobic and resistive training based on initial evaluation findings, risk stratification, comorbidities and participant's personal goals.       Expected Outcomes Short Term: Attend rehab on a regular basis to increase amount of physical activity.;Long Term: Add in home exercise to make exercise part of routine and to increase amount of physical activity.;Long Term: Exercising regularly at least 3-5 days a week.       Increase Strength and Stamina Yes       Intervention Provide advice, education, support and counseling about physical activity/exercise needs.;Develop an individualized exercise prescription for aerobic and resistive training based on initial evaluation findings, risk stratification, comorbidities and participant's personal goals.       Expected Outcomes Short Term: Increase workloads from initial exercise prescription for resistance, speed, and METs.;Short Term: Perform resistance training exercises routinely during rehab and add in resistance training at home;Long  Term: Improve cardiorespiratory fitness, muscular endurance and strength as measured by increased METs and functional capacity (6MWT)       Able to understand and use rate of perceived exertion (RPE) scale Yes       Intervention Provide education and explanation on how to use RPE scale       Expected Outcomes Short Term: Able to use RPE daily in rehab to express subjective intensity level;Long Term:  Able to use RPE to guide intensity level when exercising independently       Able to understand and use Dyspnea scale Yes       Intervention Provide education and explanation on how to use Dyspnea scale       Expected Outcomes Short Term: Able to use Dyspnea scale daily in rehab to express subjective sense of shortness of breath during exertion;Long Term: Able to use Dyspnea scale to guide intensity level when exercising independently       Knowledge and understanding of Target Heart Rate Range (THRR) Yes       Intervention Provide education and explanation of THRR including how the numbers were predicted and where they are located for reference       Expected Outcomes Short Term: Able to state/look up THRR;Short Term: Able to use daily as guideline for intensity in rehab;Long Term: Able to use THRR to govern intensity when exercising independently       Able to check pulse independently Yes       Intervention Provide education and demonstration on how to check pulse in carotid and  radial arteries.;Review the importance of being able to check your own pulse for safety during independent exercise       Expected Outcomes Short Term: Able to explain why pulse checking is important during independent exercise;Long Term: Able to check pulse independently and accurately       Understanding of Exercise Prescription Yes       Intervention Provide education, explanation, and written materials on patient's individual exercise prescription       Expected Outcomes Short Term: Able to explain program exercise  prescription;Long Term: Able to explain home exercise prescription to exercise independently              Exercise Goals Re-Evaluation :  Exercise Goals Re-Evaluation    Row Name 03/05/20 1523 03/13/20 1556 03/27/20 1553 04/07/20 1623 04/10/20 1607     Exercise Goal Re-Evaluation   Exercise Goals Review Increase Physical Activity;Knowledge and understanding of Target Heart Rate Range (THRR);Able to understand and use rate of perceived exertion (RPE) scale;Understanding of Exercise Prescription;Increase Strength and Stamina;Able to check pulse independently Increase Physical Activity;Increase Strength and Stamina;Understanding of Exercise Prescription Increase Physical Activity;Increase Strength and Stamina;Understanding of Exercise Prescription Increase Physical Activity;Increase Strength and Stamina;Understanding of Exercise Prescription Increase Strength and Stamina;Increase Physical Activity   Comments Reviewed RPE and dyspnea scales, THR and program prescription with pt today.  Pt voiced understanding and was given a copy of goals to take home. Octavia Bruckner is off to a good start in rehab.  He has taken to the exercise very well with his frist four days of exercise.  We will continue to monitor her progress. Octavia Bruckner is doing well in rehab.  He is starting to notice that his strength and stamina are starting to recover.  He has also noticed that his breathing is getting better as it is easier to do things.  Reviewed home exercise with pt today.  Pt plans to walk and staff videos at home for exercise.  Reviewed THR, pulse, RPE, sign and symptoms, pulse oximetery and when to call 911 or MD.  Also discussed weather considerations and indoor options.  Pt voiced understanding. Octavia Bruckner continues to do well in rehab.  He is up to 16 watts on the bike.  We will continue to montior his progress. Patient states that he is not doing any exercise at home. He does not have the motivation to workout at home. Talked to patient why  it is important to exercise at home. He states that he is able to monitor his heart rate at home. He is onboard with trying to walk a few days a week outside of rehab.   Expected Outcomes Short: Use RPE daily to regulate intensity. Long: Follow program prescription in THR. Short: Continue to attend regularly Long: Continue to follow program prescription. Short: Start to add in more exercise at home. Long: Continue to improve stamina. Short: Increase treadmill Long: Continue to improve stamina. Short: add two days of walking at home. Long: add a routine of home exercise daily after [place] Name 04/22/20 1424 05/05/20 1525 05/21/20 1338 05/26/20 1643 06/04/20 1509     Exercise Goal Re-Evaluation   Exercise Goals Review Increase Strength and Stamina;Increase Physical Activity;Understanding of Exercise Prescription Increase Strength and Stamina;Increase Physical Activity;Understanding of Exercise Prescription Increase Strength and Stamina;Increase Physical Activity;Understanding of Exercise Prescription Increase Strength and Stamina;Increase Physical Activity;Understanding of Exercise Prescription Increase Strength and Stamina;Increase Physical Activity;Understanding of Exercise Prescription   Comments Octavia Bruckner is doing well, he has increased to a  1.7 speed on the treadmill. Will continue to focus on increasing workloads. Octavia Bruckner continues to well in rehab.  He is now up to to 2.5 METs on bike.  We will continue to monitor his progress. Tim missed a couple weeks due to illness.  He is working on building back up to previous levels. Octavia Bruckner is doing well in rehab. He has not been exercising at home as he just doesn't feel like it. We talked about how exercise will help him feel better and improve his breathing. Octavia Bruckner is doing well in rehab. However, on Monday, he came 30 min late for class and was not really clear and aware that he had been coming to class.  He had said that he had just been here wondering around and  thought he would drop in to say hello.  We will continue to keep a close eye on him and his memory.  He did improve his post 6MWT by over 200 ft!!  We will continue to monitor his progress.   Expected Outcomes Short: Continue attending rehab regularly Long: Increase MET level and stamina/ endurance Short: Increase treadmill back up and increase T5 NuStep.  Long; Continue to improve stamina. Short: get back to regular attendance Long:  work up to previous intensity Short: Add in exercise at home.  Long: Continue to improve stamina. Short: Set up routine at home Long: Continue to exercise routinely.          Discharge Exercise Prescription (Final Exercise Prescription Changes):  Exercise Prescription Changes - 06/04/20 1500      Response to Exercise   Blood Pressure (Admit) 136/62    Blood Pressure (Exercise) 138/64    Blood Pressure (Exit) 124/70    Heart Rate (Admit) 66 bpm    Heart Rate (Exercise) 89 bpm    Heart Rate (Exit) 69 bpm    Rating of Perceived Exertion (Exercise) 11    Symptoms none    Duration Continue with 30 min of aerobic exercise without signs/symptoms of physical distress.    Intensity THRR unchanged      Progression   Progression Continue to progress workloads to maintain intensity without signs/symptoms of physical distress.    Average METs 2.47      Resistance Training   Training Prescription Yes    Weight 4 lb    Reps 10-15      Interval Training   Interval Training No      Treadmill   MPH 1.7    Grade 0    Minutes 15    METs 2.3      Recumbant Bike   Level 3    Minutes 15      NuStep   Level 4    Minutes 15    METs 3.2      T5 Nustep   Level 2    Minutes 15    METs 1.9      Home Exercise Plan   Plans to continue exercise at Home (comment)   walking and staff videos   Frequency Add 2 additional days to program exercise sessions.    Initial Home Exercises Provided 03/27/20           Nutrition:  Target Goals: Understanding of  nutrition guidelines, daily intake of sodium <1578m, cholesterol <2043m calories 30% from fat and 7% or less from saturated fats, daily to have 5 or more servings of fruits and vegetables.  Education: Controlling Sodium/Reading Food Labels -Group verbal and written material supporting the  discussion of sodium use in heart healthy nutrition. Review and explanation with models, verbal and written materials for utilization of the food label.   Education: General Nutrition Guidelines/Fats and Fiber: -Group instruction provided by verbal, written material, models and posters to present the general guidelines for heart healthy nutrition. Gives an explanation and review of dietary fats and fiber.   Biometrics:  Pre Biometrics - 03/04/20 1517      Pre Biometrics   Height 5' 11.5" (1.816 m)    Weight 197 lb 9.6 oz (89.6 kg)    BMI (Calculated) 27.18    Single Leg Stand 7.75 seconds           Post Biometrics - 05/29/20 1614       Post  Biometrics   Height 5' 11.5" (1.816 m)    Weight 200 lb (90.7 kg)    BMI (Calculated) 27.51    Single Leg Stand 5.37 seconds           Nutrition Therapy Plan and Nutrition Goals:  Nutrition Therapy & Goals - 03/31/20 1604      Nutrition Therapy   Diet heart healthy, low Na, diabetes friendly    Protein (specify units) 70-75g    Fiber 30 grams    Whole Grain Foods 3 servings    Saturated Fats 12 max. grams    Fruits and Vegetables 5 servings/day    Sodium 1.5 grams      Personal Nutrition Goals   Nutrition Goal ST:LT: no goals at this time    Comments A1C: 6. grape nuts, sandwich for lunch [place] or chicken salad, wife fixes dinner - mostly protein (bologna, hamburger, etc.). Discussed heart healthy eating and diabetes friendly eating. Will f/u and include wife for re-evaluation.      Intervention Plan   Intervention Prescribe, educate and counsel regarding individualized specific dietary modifications aiming towards targeted core components  such as weight, hypertension, lipid management, diabetes, heart failure and other comorbidities.;Nutrition handout(s) given to patient.    Expected Outcomes Short Term Goal: Understand basic principles of dietary content, such as calories, fat, sodium, cholesterol and nutrients.;Short Term Goal: A plan has been developed with personal nutrition goals set during dietitian appointment.;Long Term Goal: Adherence to prescribed nutrition plan.           Nutrition Assessments:  Nutrition Assessments - 03/05/20 0919      MEDFICTS Scores   Pre Score 86           MEDIFICTS Score Key:          ?70 Need to make dietary changes          40-70 Heart Healthy Diet         ? 40 Therapeutic Level Cholesterol Diet  Nutrition Goals Re-Evaluation:  Nutrition Goals Re-Evaluation    [place] Name 05/26/20 1637             Goals   Comment Tim continues to eat as healthy as he can.  He still will eat what he wants but does not over do it. He continues to try to pick healthier snacks.       Expected Outcome Short: Continue to work on eating better snacks.  Long: Continue to eat heart healthy.              Nutrition Goals Discharge (Final Nutrition Goals Re-Evaluation):  Nutrition Goals Re-Evaluation - 05/26/20 1637      Goals   Comment Tim continues to eat as healthy as he can.  He still will eat what he wants but does not over do it. He continues to try to pick healthier snacks.    Expected Outcome Short: Continue to work on eating better snacks.  Long: Continue to eat heart healthy.           Psychosocial: Target Goals: Acknowledge presence or absence of significant depression and/or stress, maximize coping skills, provide positive support system. Participant is able to verbalize types and ability to use techniques and skills needed for reducing stress and depression.   Education: Depression - Provides group verbal and written instruction on the correlation between heart/lung disease and  depressed mood, treatment options, and the stigmas associated with seeking treatment.   Education: Sleep Hygiene -Provides group verbal and written instruction about how sleep can affect your health.  Define sleep hygiene, discuss sleep cycles and impact of sleep habits. Review good sleep hygiene tips.     Education: Stress and Anxiety: - Provides group verbal and written instruction about the health risks of elevated stress and causes of high stress.  Discuss the correlation between heart/lung disease and anxiety and treatment options. Review healthy ways to manage with stress and anxiety.    Initial Review & Psychosocial Screening:  Initial Psych Review & Screening - 03/03/20 1406      Initial Review   Current issues with History of Depression;Current Psychotropic Meds;Current Anxiety/Panic;Current Depression;Current Stress Concerns    Source of Stress Concerns Chronic Illness    Comments more depression then anxiety and currently well managed on meds with Dr. Sabra Heck, overall doing well      [place]? Yes   wife, daughter (lives within a few miles)     Barriers   Psychosocial barriers to participate in program The patient should benefit from training in stress management and relaxation.;Psychosocial barriers identified (see note)      Screening Interventions   Interventions Encouraged to exercise;Provide feedback about the scores to participant;To provide support and resources with identified psychosocial needs    Expected Outcomes Short Term goal: Utilizing psychosocial counselor, staff and physician to assist with identification of specific Stressors or current issues interfering with healing process. Setting desired goal for each stressor or current issue identified.;Long Term Goal: Stressors or current issues are controlled or eliminated.;Short Term goal: Identification and review with participant of any Quality of Life or Depression concerns found by  scoring the questionnaire.;Long Term goal: The participant improves quality of Life and PHQ9 Scores as seen by post scores and/or verbalization of changes           Quality of Life Scores:   Quality of Life - 03/04/20 1518      Quality of Life   Select Quality of Life      Quality of Life Scores   Health/Function Pre 20.2 %    Socioeconomic Pre 21.07 %    Psych/Spiritual Pre 24.43 %    Family Pre 22.8 %    GLOBAL Pre 21.63 %          Scores of 19 and below usually indicate a poorer quality of life in these areas.  A difference of  2-3 points is a clinically meaningful difference.  A difference of 2-3 points in the total score of the Quality of Life Index has been associated with significant improvement in overall quality of life, self-image, physical symptoms, and general health in studies assessing change in quality of life.  PHQ-9: Recent Review Flowsheet Data  Depression screen [facility] 2/9 03/04/2020 11/20/2018   Decreased Interest 0 0   Down, Depressed, Hopeless 0 0   PHQ - 2 Score 0 0   Altered sleeping 0 -   Tired, decreased energy 2 -   Change in appetite 0 -   Feeling bad or failure about yourself  0 -   Trouble concentrating 0 -   Moving slowly or fidgety/restless 0 -   Suicidal thoughts 0 -   PHQ-9 Score 2 -     Interpretation of Total Score  Total Score Depression Severity:  1-4 = Minimal depression, 5-9 = Mild depression, 10-14 = Moderate depression, 15-19 = Moderately severe depression, 20-27 = Severe depression   Psychosocial Evaluation and Intervention:  Psychosocial Evaluation - 03/03/20 1413      Psychosocial Evaluation & Interventions   Interventions Stress management education;Encouraged to exercise with the program and follow exercise prescription    Comments Octavia Bruckner is 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  He has a great support system in his wife and daughter (who lives near by).  He really wants to try to avoid any of this  happening agin. He has never really exercised consistently before and is curious as to how it will go for him.  He wants to make sure he does not have any drawbacks.  He does have a history of depression with a little bit of anxiety but is currently well managed on plaxil.  Exercise should also help give him a mental boost.    Expected Outcomes Short: Attend rehab for mood boost with exercise. Long: Get into exercise routine.    Continue Psychosocial Services  Follow up required by staff           Psychosocial Re-Evaluation:  Psychosocial Re-Evaluation    [place] Name 03/27/20 1555 04/10/20 1604 05/26/20 1641         Psychosocial Re-Evaluation   Current issues with Current Stress Concerns None Identified None Identified     Comments Octavia Bruckner is doing well in rehab.  He is doing well mentally as well and denies current symptoms of depression and anxiety.  He is sleeping good most nights. Overall things are going well. Patient reports no issues with their current mental states, sleep, stress, depression or anxiety. Will follow up with patient in a few weeks for any changes. Octavia Bruckner is doing well in rehab.  He is sleeping well and denies any current stressors.  He was sick a few weeks ago, but is now feeling much better!  He does not exericse at home.  His biggest concern continues to be his breathing which limits what he can do.     Expected Outcomes Short: Continue to attend rehab for strength  Long: Continue to stay positive. Short: Continue to exercise regularly to support mental health and notify staff of any changes. Long: maintain mental health and well being through teaching of rehab or prescribed medications independently. Short: Continue to work on breathing Long: Continue to stay positive     Interventions Encouraged to attend Cardiac Rehabilitation for the exercise Encouraged to attend Cardiac Rehabilitation for the exercise Encouraged to attend Cardiac Rehabilitation for the exercise     Continue  Psychosocial Services  Follow up required by staff Follow up required by staff Follow up required by staff            Psychosocial Discharge (Final Psychosocial Re-Evaluation):  Psychosocial Re-Evaluation - 05/26/20 1641      Psychosocial Re-Evaluation  Current issues with None Identified    Comments Octavia Bruckner is doing well in rehab.  He is sleeping well and denies any current stressors.  He was sick a few weeks ago, but is now feeling much better!  He does not exericse at home.  His biggest concern continues to be his breathing which limits what he can do.    Expected Outcomes Short: Continue to work on breathing Long: Continue to stay positive    Interventions Encouraged to attend Cardiac Rehabilitation for the exercise    Continue Psychosocial Services  Follow up required by staff           Vocational Rehabilitation: Provide vocational rehab assistance to qualifying candidates.   Vocational Rehab Evaluation & Intervention:  Vocational Rehab - 03/03/20 1406      Initial Vocational Rehab Evaluation & Intervention   Assessment shows need for Vocational Rehabilitation No   retired          Education: Education Goals: Education classes will be provided on a variety of topics geared toward better understanding of heart health and risk factor modification. Participant will state understanding/return demonstration of topics presented as noted by education test scores.  Learning Barriers/Preferences:  Learning Barriers/Preferences - 03/03/20 1405      Learning Barriers/Preferences   Learning Barriers Sight   glasses   Learning Preferences Skilled Demonstration           General Cardiac Education Topics:  AED/CPR: - Group verbal and written instruction with the use of models to demonstrate the basic use of the AED with the basic ABC's of resuscitation.   Anatomy & Physiology of the Heart: - Group verbal and written instruction and models provide basic cardiac anatomy and  physiology, with the coronary electrical and arterial systems. Review of Valvular disease and Heart Failure   Cardiac Procedures: - Group verbal and written instruction to review commonly prescribed medications for heart disease. Reviews the medication, class of the drug, and side effects. Includes the steps to properly store meds and maintain the prescription regimen. (beta blockers and nitrates)   Cardiac Medications I: - Group verbal and written instruction to review commonly prescribed medications for heart disease. Reviews the medication, class of the drug, and side effects. Includes the steps to properly store meds and maintain the prescription regimen.   Cardiac Medications II: -Group verbal and written instruction to review commonly prescribed medications for heart disease. Reviews the medication, class of the drug, and side effects. (all other drug classes)   Cardiac Rehab from 05/28/2020 in [facility] Cardiac and Pulmonary Rehab  Date 05/28/20  Educator AS  Instruction Review Code 1- Verbalizes Understanding       Go Sex-Intimacy & Heart Disease, Get SMART - Goal Setting: - Group verbal and written instruction through game format to discuss heart disease and the return to sexual intimacy. Provides group verbal and written material to discuss and apply goal setting through the application of the S.M.A.R.T. Method.   Other Matters of the Heart: - Provides group verbal, written materials and models to describe Stable Angina and Peripheral Artery. Includes description of the disease process and treatment options available to the cardiac patient.   Infection Prevention: - Provides verbal and written material to individual with discussion of infection control including proper hand washing and proper equipment cleaning during exercise session.   Cardiac Rehab from 05/28/2020 in [facility] Cardiac and Pulmonary Rehab  Date 03/04/20  Educator AS  Instruction Review Code 1- Verbalizes  Understanding  Falls Prevention: - Provides verbal and written material to individual with discussion of falls prevention and safety.   Cardiac Rehab from 05/28/2020 in [facility] Cardiac and Pulmonary Rehab  Date 03/04/20  Educator AS  Instruction Review Code 1- Verbalizes Understanding      Other: -Provides group and verbal instruction on various topics (see comments)   Knowledge Questionnaire Score:  Knowledge Questionnaire Score - 03/04/20 1518      Knowledge Questionnaire Score   Pre Score 22/26           Core Components/Risk Factors/Patient Goals at Admission:  Personal Goals and Risk Factors at Admission - 03/04/20 1522      Core Components/Risk Factors/Patient Goals on Admission    Weight Management Yes    Intervention Weight Management: Develop a combined nutrition and exercise program designed to reach desired caloric intake, while maintaining appropriate intake of nutrient and fiber, sodium and fats, and appropriate energy expenditure required for the weight goal.;Weight Management: Provide education and appropriate resources to help participant work on and attain dietary goals.    Admit Weight 197 lb 9.6 oz (89.6 kg)    Goal Weight: Short Term 195 lb (88.5 kg)    Goal Weight: Long Term 190 lb (86.2 kg)    Expected Outcomes Short Term: Continue to assess and modify interventions until short term weight is achieved;Weight Maintenance: Understanding of the daily nutrition guidelines, which includes 25-35% calories from fat, 7% or less cal from saturated fats, less than 264m cholesterol, less than 1.5gm of sodium, & 5 or more servings of fruits and vegetables daily    Diabetes Yes    Intervention Provide education about signs/symptoms and action to take for hypo/hyperglycemia.;Provide education about proper nutrition, including hydration, and aerobic/resistive exercise prescription along with prescribed medications to achieve blood glucose in normal ranges: Fasting glucose  65-99 mg/dL    Expected Outcomes Short Term: Participant verbalizes understanding of the signs/symptoms and immediate care of hyper/hypoglycemia, proper foot care and importance of medication, aerobic/resistive exercise and nutrition plan for blood glucose control.;Long Term: Attainment of HbA1C < 7%.    Hypertension Yes    Intervention Provide education on lifestyle modifcations including regular physical activity/exercise, weight management, moderate sodium restriction and increased consumption of fresh fruit, vegetables, and low fat dairy, alcohol moderation, and smoking cessation.;Monitor prescription use compliance.    Expected Outcomes Short Term: Continued assessment and intervention until BP is < 140/971mHG in hypertensive participants. < 130/8030mG in hypertensive participants with diabetes, heart failure or chronic kidney disease.;Long Term: Maintenance of blood pressure at goal levels.    Lipids Yes    Intervention Provide education and support for participant on nutrition & aerobic/resistive exercise along with prescribed medications to achieve LDL <34m63mDL >40mg23m Expected Outcomes Short Term: Participant states understanding of desired cholesterol values and is compliant with medications prescribed. Participant is following exercise prescription and nutrition guidelines.;Long Term: Cholesterol controlled with medications as prescribed, with individualized exercise RX and with personalized nutrition plan. Value goals: LDL < 34mg,94m > 40 mg.           Education:Diabetes - Individual verbal and written instruction to review signs/symptoms of diabetes, desired ranges of glucose level fasting, after meals and with exercise. Acknowledge that pre and post exercise glucose checks will be done for 3 sessions at entry of program.   Education: Know Your Numbers and Risk Factors: -Group verbal and written instruction about important numbers in your health.  Discussion of what are  risk  factors and how they play a role in the disease process.  Review of Cholesterol, Blood Pressure, Diabetes, and BMI and the role they play in your overall health.   Cardiac Rehab from 05/28/2020 in [facility] Cardiac and Pulmonary Rehab  Date 05/28/20  Educator AS  Instruction Review Code 1- Verbalizes Understanding      Core Components/Risk Factors/Patient Goals Review:   Goals and Risk Factor Review    [place] Name 03/27/20 1556 04/10/20 1602 05/26/20 1640         Core Components/Risk Factors/Patient Goals Review   Personal Goals Review Weight Management/Obesity;Hypertension;Lipids Weight Management/Obesity;Hypertension;Diabetes;Improve shortness of breath with ADL's Weight Management/Obesity;Hypertension;Diabetes;Improve shortness of breath with ADL's     Review Octavia Bruckner is doing well and his weight is staying steady. His blood pressures are doing well in class and at home.  He checks them routinely.  Overall, things are improving. Spoke to patient about their shortness of breath and what they can do to improve. Patient has been informed of breathing techniques when starting the program. Patient is informed to tell staff if they have had any med changes and that certain meds they are taking or not taking can be causing shortness of breath. Octavia Bruckner is doing well in rehab. His breathing continues to be his biggest concern.  He has not really noticed much improvement yet, but he is also not doing anything at home and we talked about adding in exercise.  His weight has been steady and blood pressures have been good.  He has not been checking his blood sugars but has not felt any major swings.     Expected Outcomes Short: Continue to work on weight loss Long; Continue to monitor risk factors. Short: Attend [facility] regularly to improve shortness of breath with ADL's. Long: maintain independence with ADL's Short: Add in exercise at home Long: Continue to work on weight loss            Core Components/Risk  Factors/Patient Goals at Discharge (Final Review):   Goals and Risk Factor Review - 05/26/20 1640      Core Components/Risk Factors/Patient Goals Review   Personal Goals Review Weight Management/Obesity;Hypertension;Diabetes;Improve shortness of breath with ADL's    Review Octavia Bruckner is doing well in rehab. His breathing continues to be his biggest concern.  He has not really noticed much improvement yet, but he is also not doing anything at home and we talked about adding in exercise.  His weight has been steady and blood pressures have been good.  He has not been checking his blood sugars but has not felt any major swings.    Expected Outcomes Short: Add in exercise at home Long: Continue to work on weight loss           ITP Comments:  ITP Comments    Row Name 03/03/20 1425 03/04/20 1529 03/05/20 0847 03/05/20 1523 04/02/20 0611   ITP Comments Completed virtual orientation today.  EP evaluation is scheduled for Tuesday 5/18  at 2pm.  Documentation for diagnosis can be found in [facility] encounter 02/11/20. Completed 6MWT and gym orientation.  Initial ITP created and sent for review to Dr. Emily Filbert, Medical Director. 30 Day review completed. Medical Director review done, changes made as directed,and approval shown by signature of Market researcher.  New to program First full day of exercise!  Patient was oriented to gym and equipment including functions, settings, policies, and procedures.  Patient's individual exercise prescription and treatment plan were reviewed.  All starting workloads  were established based on the results of the 6 minute walk test done at initial orientation visit.  The plan for exercise progression was also introduced and progression will be customized based on patient's performance and goals. 30 Day review completed. Medical Director ITP review done, changes made as directed, and signed approval by Medical Director.   [place] Name 04/30/20 0908 05/28/20 0625 06/11/20 1621       ITP  Comments 30 Day review completed. Medical Director ITP review done, changes made as directed, and signed approval by Medical Director. 30 Day review completed. Medical Director ITP review done, changes made as directed, and signed approval by Medical Director. Eliam graduated today from  rehab with 36 sessions completed.  Details of the patient's exercise prescription and what He needs to do in order to continue the prescription and progress were discussed with patient.  Patient was given a copy of prescription and goals.  Patient verbalized understanding.  Harshaan plans to continue to exercise by walking at home.            Comments: Discharge ITP

## 2020-06-11 NOTE — Progress Notes (Signed)
Discharge Progress Report  Patient Details  Name: Brent Alvarado MRN: [id number] Date of Birth: [birth year] Referring Provider:     Cardiac Rehab from 03/04/2020 in [facility]  Referring Provider Humphrey Rolls, [place]       Number of Visits: 36  Reason for Discharge:  Patient reached a stable level of exercise. Patient independent in their exercise. Patient has met program and personal goals.  Smoking History:  Social History   Tobacco Use  Smoking Status Never Smoker  Smokeless Tobacco Never Used    Diagnosis:  Status post coronary artery stent placement  ADL [facility]:   Initial Exercise Prescription:  Initial Exercise Prescription - 03/04/20 1500      Date of Initial Exercise RX and Referring Provider   Date 03/04/20    Referring Provider Humphrey Rolls, [place]      Treadmill   MPH 1.5    Grade 0    Minutes 15    METs 2.15      Recumbant Bike   Level 1    RPM 60    Minutes 15    METs 2.13      NuStep   Level 2    SPM 80    Minutes 15    METs 2.13      REL-XR   Level 2    Speed 50    Minutes 15    METs 2.13      Prescription Details   Frequency (times per week) 3    Duration Progress to 30 minutes of continuous aerobic without signs/symptoms of physical distress      Intensity   THRR 40-80% of Max Heartrate 97-129    Ratings of Perceived Exertion 11-15    Perceived Dyspnea 0-4      Resistance Training   Training Prescription Yes    Weight 3 lb    Reps 10-15           Discharge Exercise Prescription (Final Exercise Prescription Changes):  Exercise Prescription Changes - 06/04/20 1500      Response to Exercise   Blood Pressure (Admit) 136/62    Blood Pressure (Exercise) 138/64    Blood Pressure (Exit) 124/70    Heart Rate (Admit) 66 bpm    Heart Rate (Exercise) 89 bpm    Heart Rate (Exit) 69 bpm    Rating of Perceived Exertion (Exercise) 11    Symptoms none    Duration Continue with 30 min of aerobic exercise without  signs/symptoms of physical distress.    Intensity THRR unchanged      Progression   Progression Continue to progress workloads to maintain intensity without signs/symptoms of physical distress.    Average METs 2.47      Resistance Training   Training Prescription Yes    Weight 4 lb    Reps 10-15      Interval Training   Interval Training No      Treadmill   MPH 1.7    Grade 0    Minutes 15    METs 2.3      Recumbant Bike   Level 3    Minutes 15      NuStep   Level 4    Minutes 15    METs 3.2      T5 Nustep   Level 2    Minutes 15    METs 1.9      Home Exercise Plan   Plans to continue exercise at Home (comment)  walking and staff videos   Frequency Add 2 additional days to program exercise sessions.    Initial Home Exercises Provided 03/27/20           Functional Capacity:  6 Minute Walk    Row Name 03/04/20 1512 05/29/20 1612       6 Minute Walk   Phase Initial Discharge    Distance 1098 feet 1300 feet    Distance % Change -- 18.4 %    Distance Feet Change -- 202 ft    Walk Time 6 minutes 6 minutes    # of Rest Breaks 0 0    MPH 2.08 2.46    METS 2.13 2.64    RPE 13 11    Perceived Dyspnea  2 --    VO2 Peak 7.45 9.23    Symptoms Yes (comment) Yes (comment)    Comments hip pain ( been for 30 years) hip pain 4/10    Resting HR 65 bpm 66 bpm    Resting BP 108/64 136/62    Resting Oxygen Saturation  97 % --    Exercise Oxygen Saturation  during 6 min walk 98 % --    Max Ex. HR 90 bpm 89 bpm    Max Ex. BP 110/54 138/64    2 Minute Post BP 110/64 --           Psychological, QOL, Others - Outcomes: PHQ 2/9: Depression screen [facility] 2/9 03/04/2020 11/20/2018  Decreased Interest 0 0  Down, Depressed, Hopeless 0 0  PHQ - 2 Score 0 0  Altered sleeping 0 -  Tired, decreased energy 2 -  Change in appetite 0 -  Feeling bad or failure about yourself  0 -  Trouble concentrating 0 -  Moving slowly or fidgety/restless 0 -  Suicidal thoughts 0 -  PHQ-9  Score 2 -    Quality of Life:  Quality of Life - 03/04/20 1518      Quality of Life   Select Quality of Life      Quality of Life Scores   Health/Function Pre 20.2 %    Socioeconomic Pre 21.07 %    Psych/Spiritual Pre 24.43 %    Family Pre 22.8 %    GLOBAL Pre 21.63 %          Nutrition & Weight - Outcomes:  Pre Biometrics - 03/04/20 1517      Pre Biometrics   Height 5' 11.5" (1.816 m)    Weight 197 lb 9.6 oz (89.6 kg)    BMI (Calculated) 27.18    Single Leg Stand 7.75 seconds           Post Biometrics - 05/29/20 1614       Post  Biometrics   Height 5' 11.5" (1.816 m)    Weight 200 lb (90.7 kg)    BMI (Calculated) 27.51    Single Leg Stand 5.37 seconds           Nutrition:  Nutrition Therapy & Goals - 03/31/20 1604      Nutrition Therapy   Diet heart healthy, low Na, diabetes friendly    Protein (specify units) 70-75g    Fiber 30 grams    Whole Grain Foods 3 servings    Saturated Fats 12 max. grams    Fruits and Vegetables 5 servings/day    Sodium 1.5 grams      Personal Nutrition Goals   Nutrition Goal ST:LT: no goals at this time    Comments  A1C: 6. grape nuts, sandwich for lunch [place] or chicken salad, wife fixes dinner - mostly protein (bologna, hamburger, etc.). Discussed heart healthy eating and diabetes friendly eating. Will f/u and include wife for re-evaluation.      Intervention Plan   Intervention Prescribe, educate and counsel regarding individualized specific dietary modifications aiming towards targeted core components such as weight, hypertension, lipid management, diabetes, heart failure and other comorbidities.;Nutrition handout(s) given to patient.    Expected Outcomes Short Term Goal: Understand basic principles of dietary content, such as calories, fat, sodium, cholesterol and nutrients.;Short Term Goal: A plan has been developed with personal nutrition goals set during dietitian appointment.;Long Term Goal: Adherence to prescribed  nutrition plan.           Nutrition Discharge:  Nutrition Assessments - 03/05/20 0919      MEDFICTS Scores   Pre Score 86           Education Questionnaire Score:  Knowledge Questionnaire Score - 03/04/20 1518      Knowledge Questionnaire Score   Pre Score 22/26           Goals reviewed with patient; copy given to patient.

## 2021-08-24 IMAGING — CT CT ABD-PELV W/ CM
2 of 5 series · 16 of 46 positions shown, 18 images · IV contrast (omnipaque)
Comparison: 03/28/2018.

CLINICAL DATA: Nonacute abdominal pain and distension.

EXAM:
CT ABDOMEN AND PELVIS WITH CONTRAST
TECHNIQUE: Multidetector CT imaging of the abdomen and pelvis was performed
using the standard protocol following bolus administration of
intravenous contrast.
CONTRAST:  100mL OMNIPAQUE IOHEXOL 300 MG/ML  SOLN

[Series 2: routine abd/pel with · axial · 0.75mm/px · z∈[-516,-56]mm · 13 of 104 slices shown, 15 images]
[im 6/104  soft-tissue]
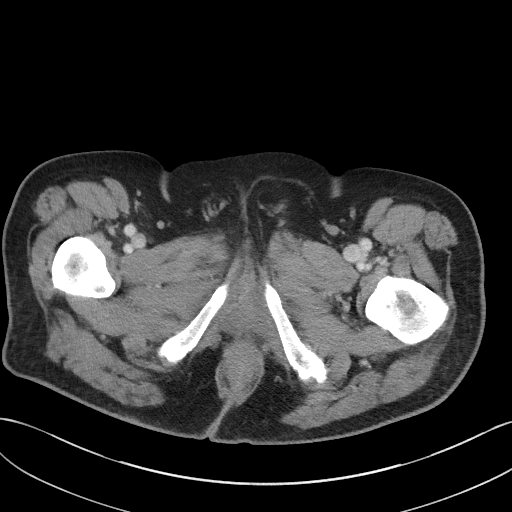
[im 6/104  bone]
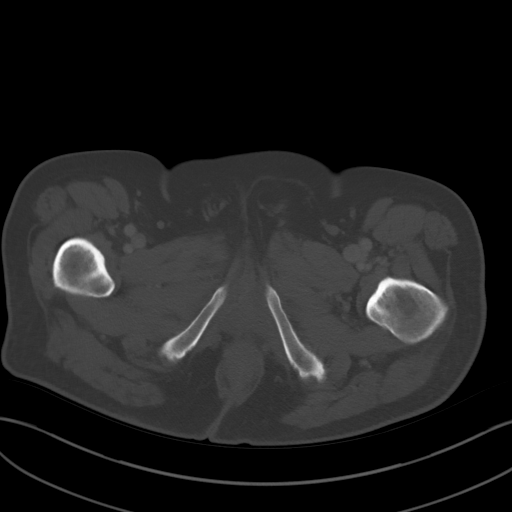
[im 17/104  soft-tissue]
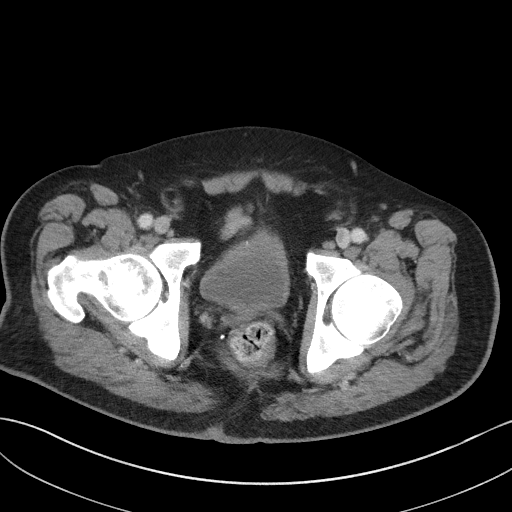
[im 22/104  soft-tissue]
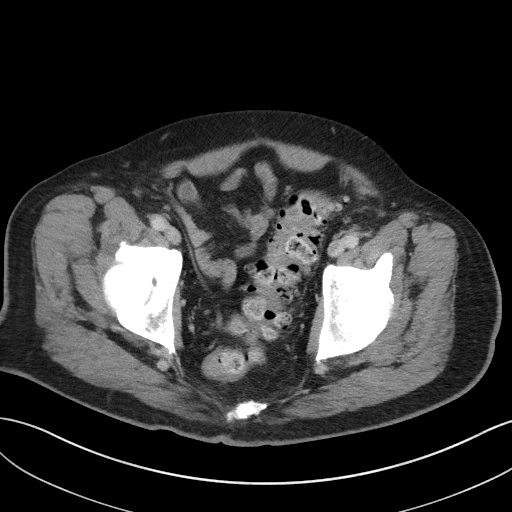
[im 28/104  soft-tissue]
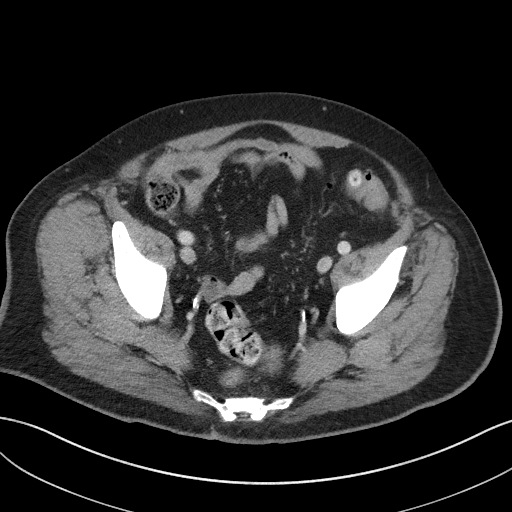
[im 38/104  soft-tissue]
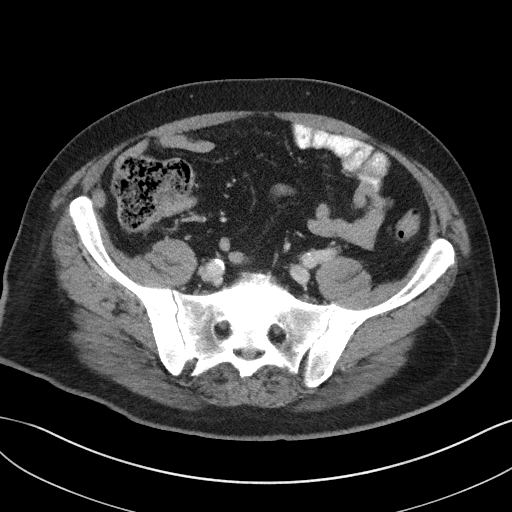
[im 44/104  soft-tissue]
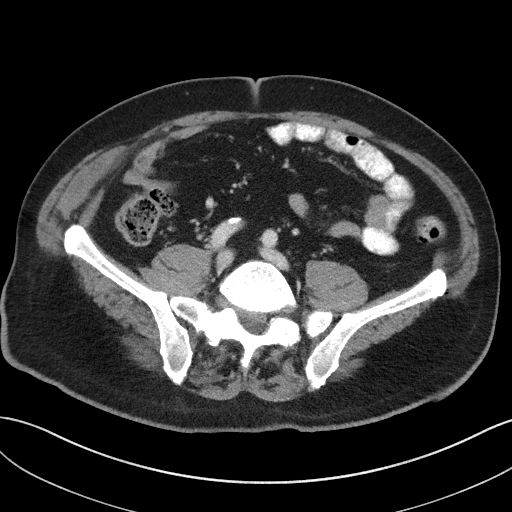
[im 55/104  soft-tissue]
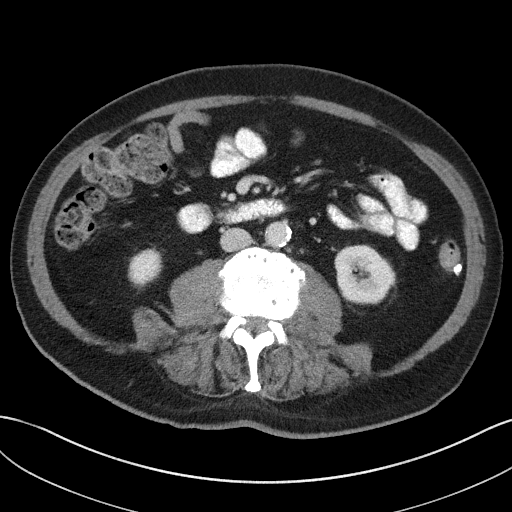
[im 60/104  soft-tissue]
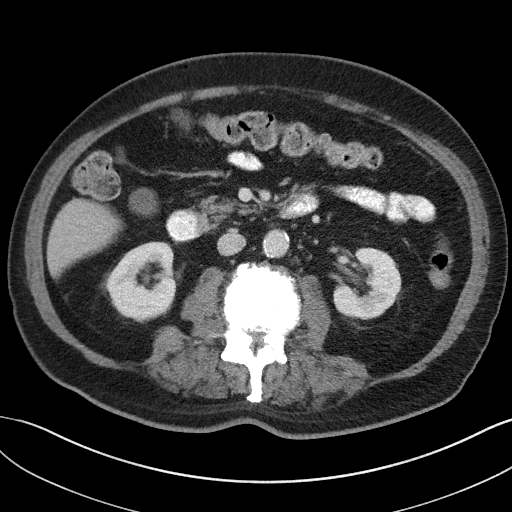
[im 66/104  soft-tissue]
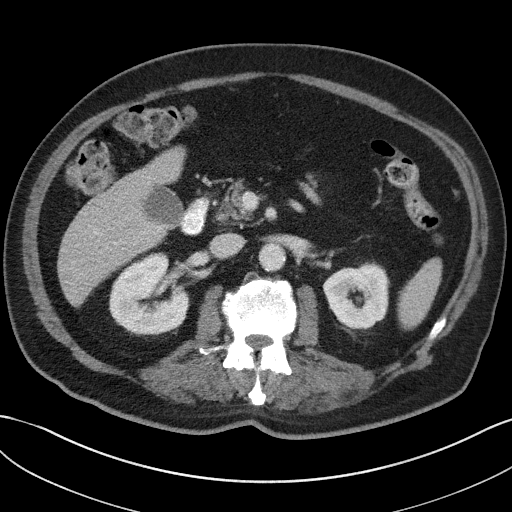
[im 66/104  bone]
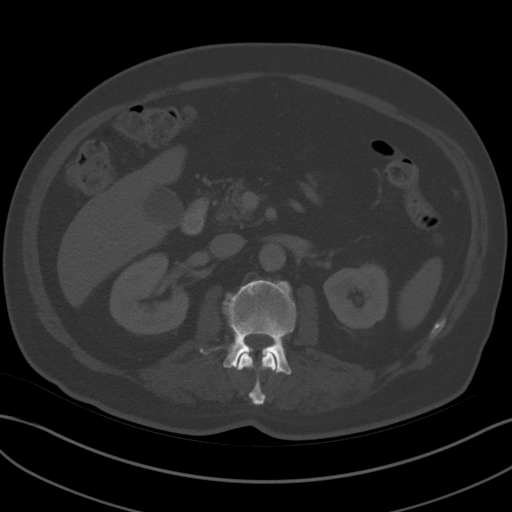
[im 76/104  soft-tissue]
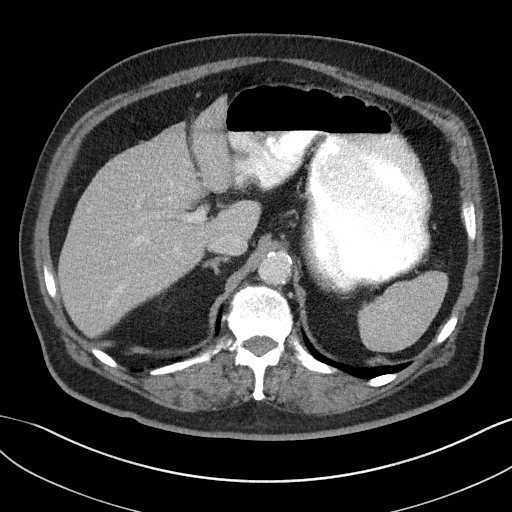
[im 82/104  soft-tissue]
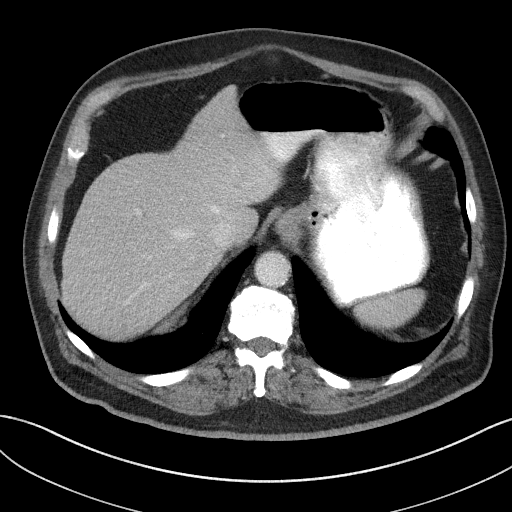
[im 87/104  soft-tissue]
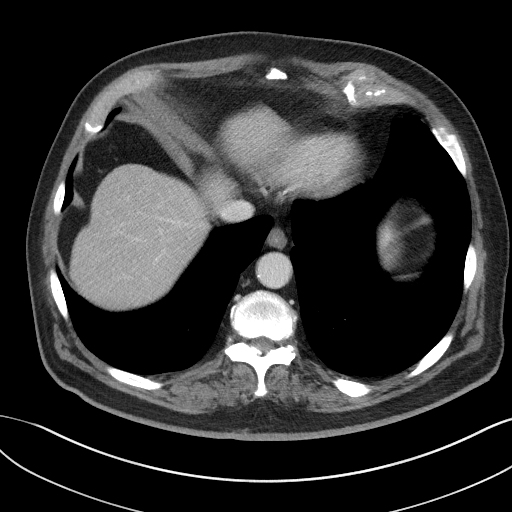
[im 98/104  soft-tissue]
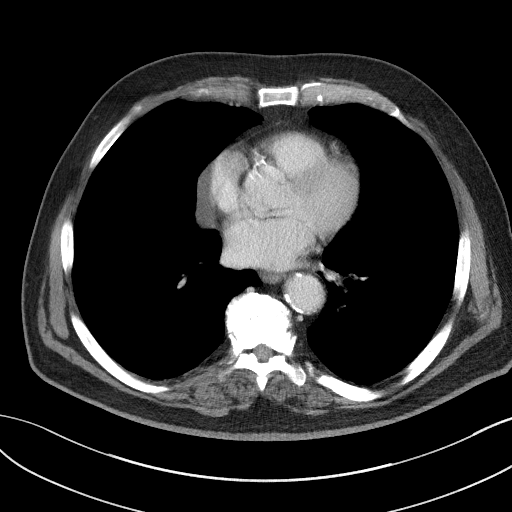

[Series 5: coronal st · coronal · 0.77mm/px · 3 of 106 slices shown]
[im 36/106  soft-tissue]
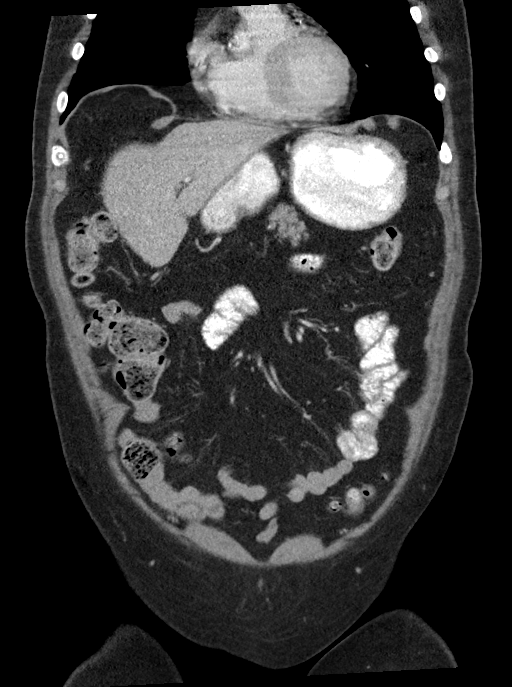
[im 47/106  soft-tissue]
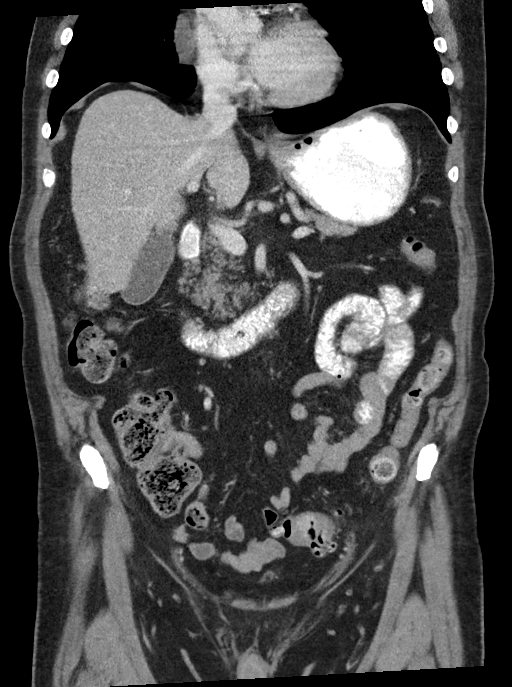
[im 59/106  soft-tissue]
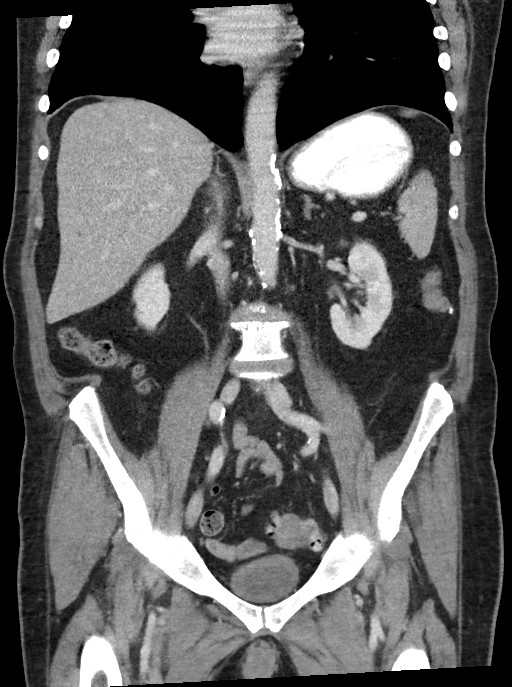

[16 of 46 positions shown; findings below may reference images not displayed]

FINDINGS: Lower chest: Dense atheromatous coronary artery calcifications.
Clear lung bases.

Hepatobiliary: Small amount of sludge or noncalcified gallstones in
the gallbladder. Unremarkable liver.

Pancreas: Unremarkable. No pancreatic ductal dilatation or
surrounding inflammatory changes.

Spleen: Normal in size without focal abnormality.

Adrenals/Urinary Tract: Adrenal glands are unremarkable. Kidneys are
normal, without renal calculi, focal lesion, or hydronephrosis.
Bladder is unremarkable.

Stomach/Bowel: Multiple sigmoid and descending colon diverticula
without evidence of diverticulitis. Normal appearing stomach, small
bowel and appendix. Prominent stool in the right colon.

Vascular/Lymphatic: Atheromatous arterial calcifications without
aneurysm. No enlarged lymph nodes.

Reproductive: Mildly enlarged prostate gland.

Other: No abdominal wall hernia or abnormality. No abdominopelvic
ascites.

Musculoskeletal: Lumbar and lower thoracic spine degenerative
changes. These include facet degenerative changes with associated
grade 1 retrolisthesis at the L2-3 and L3-4 levels.
IMPRESSION: 1. No acute abnormality.
2. Small amount of sludge or noncalcified gallstones in the
gallbladder.
3. Colonic diverticulosis.
4. Prominent stool in the right colon.
5. Dense atheromatous coronary artery calcifications.

## 2022-11-23 ENCOUNTER — Other Ambulatory Visit: Payer: Self-pay | Admitting: Cardiovascular Disease

## 2022-11-26 ENCOUNTER — Other Ambulatory Visit: Payer: Self-pay | Admitting: Cardiovascular Disease

## 2022-12-01 ENCOUNTER — Other Ambulatory Visit: Payer: Self-pay

## 2022-12-01 MED ORDER — LOSARTAN POTASSIUM 50 MG PO TABS: 50.0000 mg | ORAL_TABLET | Freq: Every morning | ORAL | 0 refills | Status: AC

## 2022-12-02 ENCOUNTER — Other Ambulatory Visit: Payer: Self-pay

## 2022-12-13 ENCOUNTER — Encounter: Payer: Self-pay | Admitting: Cardiovascular Disease

## 2022-12-13 ENCOUNTER — Ambulatory Visit: Payer: Medicare Other | Admitting: Cardiovascular Disease

## 2022-12-13 VITALS — BP 106/64 | HR 72 | Ht 74.0 in | Wt 190.0 lb

## 2022-12-13 DIAGNOSIS — I200 Unstable angina: Secondary | ICD-10-CM | POA: Diagnosis not present

## 2022-12-13 DIAGNOSIS — I10 Essential (primary) hypertension: Secondary | ICD-10-CM | POA: Diagnosis not present

## 2022-12-13 DIAGNOSIS — Z955 Presence of coronary angioplasty implant and graft: Secondary | ICD-10-CM

## 2022-12-13 DIAGNOSIS — I249 Acute ischemic heart disease, unspecified: Secondary | ICD-10-CM | POA: Diagnosis not present

## 2022-12-13 NOTE — Assessment & Plan Note (Signed)
Doing well 

## 2022-12-13 NOTE — Progress Notes (Signed)
Cardiology Office Note   Date:  12/13/2022   ID:  Brent Alvarado, Brent Alvarado [birth year], MRN [id number]  PCP:  Rusty Aus, MD  Cardiologist:  Neoma Laming, MD      History of Present Illness: Brent Alvarado is a 77 y.o. male who presents for  Chief Complaint  Patient presents with   Follow-up    3 months follow up    Dizziness This is a recurrent problem. The current episode started in the past 7 days. The problem has been unchanged.  Shortness of Breath This is a chronic problem. The current episode started 1 to 4 weeks ago. The problem has been unchanged.      Past Medical History:  Diagnosis Date   Anxiety    BPH (benign prostatic hyperplasia)    Depression    Diabetes mellitus without complication ([place])    Controlled poorly with metformin   Diverticulitis    Dyspnea    Dysrhythmia    GERD (gastroesophageal reflux disease)    Hyperlipidemia    Hypertension      Past Surgical History:  Procedure Laterality Date   COLONOSCOPY WITH PROPOFOL N/A 08/28/2015   Procedure: COLONOSCOPY WITH PROPOFOL;  Surgeon: Manya Silvas, MD;  Location: [place];  Service: Endoscopy;  Laterality: N/A;   CORONARY STENT INTERVENTION N/A 02/11/2020   Procedure: CORONARY STENT INTERVENTION;  Surgeon: Yolonda Kida, MD;  Location: [place] [facility];  Service: Cardiovascular;  Laterality: N/A;   ESOPHAGOGASTRODUODENOSCOPY (EGD) WITH PROPOFOL N/A 08/28/2015   Procedure: ESOPHAGOGASTRODUODENOSCOPY (EGD) WITH PROPOFOL;  Surgeon: Manya Silvas, MD;  Location: [facility] ENDOSCOPY;  Service: Endoscopy;  Laterality: N/A;   LEFT HEART CATH AND CORONARY ANGIOGRAPHY Left 02/11/2020   Procedure: LEFT HEART CATH AND CORONARY ANGIOGRAPHY;  Surgeon: Dionisio David, MD;  Location: [place] [facility];  Service: Cardiovascular;  Laterality: Left;   SAVORY DILATION N/A 08/28/2015   Procedure: SAVORY DILATION;  Surgeon: Manya Silvas, MD;  Location: [facility] ENDOSCOPY;  Service: Endoscopy;   Laterality: N/A;     Current Outpatient Medications  Medication Sig Dispense Refill   clopidogrel (PLAVIX) 75 MG tablet Take 1 tablet (75 mg total) by mouth daily with breakfast. 30 tablet 0   fenofibrate 54 MG tablet TAKE ONE TABLET EVERY DAY FOR TRIGLYCERIDES 90 tablet 3   furosemide (LASIX) 20 MG tablet Take 20 mg by mouth daily.      losartan (COZAAR) 50 MG tablet Take 1 tablet (50 mg total) by mouth every morning. 90 tablet 0   nitroGLYCERIN (NITROSTAT) 0.4 MG SL tablet Place 1 tablet (0.4 mg total) under the tongue every 5 (five) minutes x 3 doses as needed for chest pain. 20 tablet 1   pantoprazole (PROTONIX) 40 MG tablet Take 1 tablet by mouth 2 (two) times daily.     PARoxetine (PAXIL) 40 MG tablet Take 40 mg by mouth every morning.     polyethylene glycol (MIRALAX / GLYCOLAX) 17 g packet Take 17 g by mouth daily.     rosuvastatin (CRESTOR) 40 MG tablet Take 1 tablet by mouth daily.     sotalol (BETAPACE) 80 MG tablet Take 80 mg by mouth 2 (two) times daily.     tamsulosin (FLOMAX) 0.4 MG CAPS capsule Take 0.4 mg by mouth.     amLODipine (NORVASC) 10 MG tablet Take 10 mg by mouth daily. (Patient not taking: Reported on 12/13/2022)     aspirin 81 MG chewable tablet Chew 1 tablet (81  mg total) by mouth daily. (Patient not taking: Reported on 03/03/2020) 30 tablet 0   baclofen (LIORESAL) 10 MG tablet Take 10 mg by mouth 3 (three) times daily. (Patient not taking: Reported on 12/13/2022)     Dulaglutide (TRULICITY) A999333 0000000 SOPN Inject into the skin once a week.     glimepiride (AMARYL) 4 MG tablet Take 1 tablet by mouth daily.     ondansetron (ZOFRAN) 4 MG tablet TAKE 1 TABLET BY MOUTH 3 TIMES DAILY AS NEEDED FOR NAUSEA FOR UP TO 7 DAYS (Patient not taking: Reported on 12/13/2022)     Rivaroxaban (XARELTO) 15 MG TABS tablet TAKE ONE TABLET EVERY DAY 90 tablet 1   No current facility-administered medications for this visit.   Facility-Administered Medications Ordered in Other Visits   Medication Dose Route Frequency Provider Last Rate Last Admin   0.9 %  sodium chloride infusion  250 mL Intravenous PRN Neoma Laming A, MD       sodium chloride flush (NS) 0.9 % injection 3 mL  3 mL Intravenous Q12H Neoma Laming A, MD       sodium chloride flush (NS) 0.9 % injection 3 mL  3 mL Intravenous PRN Neoma Laming A, MD       sodium chloride flush (NS) 0.9 % injection 3 mL  3 mL Intravenous Q12H Dionisio David, MD        Allergies:   Ace inhibitors    Social History:   reports that he has never smoked. He has never used smokeless tobacco. He reports that he does not drink alcohol and does not use drugs.   Family History:  family history is not on file.    ROS:     Review of Systems  Constitutional: Negative.   HENT: Negative.    Eyes: Negative.   Respiratory:  Positive for shortness of breath.   Gastrointestinal: Negative.   Genitourinary: Negative.   Musculoskeletal: Negative.   Skin: Negative.   Neurological:  Positive for dizziness.  Endo/Heme/Allergies: Negative.   Psychiatric/Behavioral: Negative.    All other systems reviewed and are negative.     REASON FOR VISIT  Referred by Dr.Conrad Zajkowski Humphrey Rolls.        TESTS  Imaging: Computed Tomographic Angiography:  Cardiac multidetector CT was performed paying particular attention to the coronary arteries for the diagnosis of: CAD. Diagnostic Drugs:  Administered iohexol (Omnipaque) through an antecubital vein and images from the examination were analyzed for the presence and extent of coronary artery disease, using 3D image processing software. 100 mL of non-ionic contrast (Omnipaque) was used.        TEST CONCLUSIONS  Quality of study: Suboptimal/Poor.  1-Calcium score: 1098.4  Unable to evaluatie coronaries. Consider cardiac Cath.      Neoma Laming MD  Electronically signed by: Neoma Laming     Date: 01/22/2020 14:20 ACTIVE PROBLEMS    Acute Coronary Syndrome    Angina Pectoris Unstable - Overview:  Added automatically from request for surgery [phone]    Athscl heart disease of native coronary artery w/o ang pctrs    Dizziness and giddiness    Essential Hypertension    Nonrheumatic mitral (valve) insufficiency    Paroxysmal atrial fibrillation    Previous Stent Placement    Weakness     CHIEF COMPLAINT  The Chief Complaint is: Cath Results.     [facility] Follow Up.     HISTORY OF PRESENT ILLNESS  Brent Alvarado is a 77 year old  male.   Patient SOB is better, no chest pain.  Allergy list reviewed   Problem list reviewed   Medication reconciliation performed   Medication list reviewed    Dyspnea    Not feeling congested in the chest    No chest pain or discomfort   No palpitations   The heart rate was not fast     CURRENT MEDICATION    amLODIPine Besylate 10 MG Oral Tablet once a day, 90 days, 0 refills    Baclofen '10MG'$  Oral Tablet 10 MG  0 days, 0 refills    Fenofibrate 54 MG Oral Tablet Take 1 tablet by mouth ONCE daily for triglycerides., 90 days, 1 refills    Losartan Potassium 50 MG Oral Tablet TAKE ONE TABLET EVERY MORNING, 90 days, 0 refills    Oxycodone-Acetaminophen 5-'325MG'$  Oral Tablet 5-325 MG  0 days, 0 refills    PARoxetine HCl '40MG'$  Oral Tablet 40 MG  0 days, 0 refills    Rosuvastatin Calcium 40 MG Oral Tablet Take 1 tablet by mouth ONCE daily., 90 days, 2 refills    Sotalol HCl 80 MG Oral Tablet twice a day, 30 days, 2 refills    SUMAtriptan Succinate '100MG'$  Oral Tablet 100 MG  0 days, 0 refills    Tamsulosin HCl 0.'4MG'$  Oral Capsule 0.4 MG  0 days, 0 refills    Topiramate '25MG'$  Oral Tablet 25 MG  Once nightly, 0 days, 0 refills    TraMADol HCl '50MG'$  Oral Tablet 50 MG  0 days, 0 refills    Trulicity A999333 0000000 Subcutaneous Solution Pen-injector once a week 0 days, 0 refills    Vitamin B12 1000 MCG Oral Tablet Extended Release  0 days, 0 refills     PAST MEDICAL/SURGICAL HISTORY  Diagnoses:  First degree A-V block Coronary artery disease CCTA  04/2017: Ca score was 755 with moderate disease in distal LAD and moderate mid LCX, and no significant disesse in RCA although in mid RCA had step artifact. CARDIAC DONE ON 4/21, high grade lesion mid LAD 80%, and OM1 80%, only LAD lesion had stenr Post-angioplasty Mitral regurgitation Echo at [facility] 04/07/2017:     NORMAL LEFT VENTRICULAR SYSTOLIC FUNCTION WITH AN ESTIMATED EF = 50-55 %  NORMAL RIGHT VENTRICULAR SYSTOLIC FUNCTION  MILD TRICUSPID VALVE INSUFFICIENCY  TRACE MITRAL VALVE INSUFFICIENCY  NO VALVULAR STENOSIS Essential hypertension.   Hyperlipidemia.   Diabetes mellitus.   Vertigo Mild disease of carotids CTA 04/2017 Procedural:   PTCA was performed PCI of Mid LAD 80-90% lesion with DES     SOCIAL HISTORY  Tobacco use:  Not a current smoker and smoking status: Former smoker. Alcohol: Not using alcohol. Habits: Poor exercise habits exercise counseling.     ALLERGIES    ACE Inhibitors     FAMILY HISTORY  Heart disease  Hypertension     REVIEW OF SYSTEMS  Systemic: Feeling poorly (malaise).  No recent weight change. Head: No headache and no sinus pain. Cardiovascular: No chest pain or discomfort, no palpitations, and the heart rate was not fast. Pulmonary: No dyspnea, no cough, and no wheezing. Gastrointestinal: No heartburn.  No nausea, no vomiting, no abdominal pain, and no diarrhea. Neurological: No dizziness, no vertigo, and no fainting. Psychological: No anxiety and no depression.     PHYSICAL FINDINGS    Vitals taken 02/18/2020 01:55 pm  BP-Sitting R  112/60 mmHg 100 - 120/60 - 80  Pulse Rate-Sitting  60 bpm 50 - 100  Temp-Tympanic  97.6 F 96 -  101  Height  72 in 64 - 74  Weight  205 lbs 9.6 oz 121 - 205  Body Mass Index  27.9 kg/m2   Body Surface Area  2.16 m2   Oxygen Saturation  98 % 93 - 100     Vitals taken 02/11/2020 10:00 pm  Height  182.9 cm 162.56 - 187.96     Vitals taken 02/12/2020 08:12 am  Weight  89.268 kg 54.89 - 92.99  Body Mass  Index  26.69 kg/m2      Vitals taken 02/12/2020 04:08 pm  BP  118/69 mmHg 100 - 120/60 - 80  Pulse Rate  64 bpm 50 - 100  Respiration Rate  17 per min 18 - 26  Temp-Oral  36.5 C 35.56 - 38.33  Oxygen Saturation  97 % 93 - 100    General Appearance:  In no acute distress. Lungs:  Clear to auscultation. Cardiovascular: Jugular Venous Distention:  JVD not increased.   JVD not increased. Heart Rate And Rhythm:  Normal.   Normal. Heart Sounds:  Normal. Murmurs:  No murmurs were heard. Carotid Arteries:  No bruit in the carotid artery. Arterial Pulses:  Equal bilaterally and normal. Edema:  Not present. Abdomen: Auscultation:  Bowel sounds were normal. Palpation:  Abdominal non-tender. Neurological:  Oriented to time, place, and person. Right groin no hematoma.     ASSESSMENT   Shortness of breath  Coronary artery disease  Post-angioplasty  Mitral regurgitation  Hypertension  Hyperlipidemia     THERAPY   Continue current medication.  Clinical summary provided to patient.     COUNSELING/EDUCATION   Education and counseling diet     PLAN     Coronary angioplasty status Xarelto 15 MG tablet once a day, 30 days, 2 refills Plavix 75 MG tablet once a day, 30 days, 2 refills     Return to the clinic if condition worsens or new symptoms arise  Follow-up visit [facility], change xarelto to 15 mg daily contine plavix 75 daily. Start cardiac rehab.     HEALTH REMINDERS    Assess Aspirin or Other Antithrombotic satisfied 02/18/2020.    Assess Blood Pressure satisfied 02/18/2020.    Assess BMI satisfied 02/18/2020.    Assess Tobacco Use satisfied 02/18/2020.      Neoma Laming MD  Electronically signed by: Neoma Laming     Date: 02/19/2020 10:44 ACTIVE PROBLEMS    Acute Coronary Syndrome    Angina Pectoris Unstable - Overview: Added automatically from request for surgery [phone]    Athscl heart disease of native coronary artery w/o ang pctrs     Dizziness and giddiness    Essential Hypertension    Nonrheumatic mitral (valve) insufficiency    Paroxysmal atrial fibrillation    Previous Stent Placement    Weakness     CHIEF COMPLAINT  The Chief Complaint is: Cath Results.     [facility] Follow Up.     HISTORY OF PRESENT ILLNESS  Brent Alvarado is a 77 year old male.   Patient SOB is better, no chest pain.  Allergy list reviewed   Problem list reviewed   Medication reconciliation performed   Medication list reviewed    Dyspnea    Not feeling congested in the chest    No chest pain or discomfort   No palpitations   The heart rate was not fast     CURRENT MEDICATION    amLODIPine Besylate 10 MG Oral Tablet once a day, 90  days, 0 refills    Baclofen '10MG'$  Oral Tablet 10 MG  0 days, 0 refills    Fenofibrate 54 MG Oral Tablet Take 1 tablet by mouth ONCE daily for triglycerides., 90 days, 1 refills    Losartan Potassium 50 MG Oral Tablet TAKE ONE TABLET EVERY MORNING, 90 days, 0 refills    Oxycodone-Acetaminophen 5-'325MG'$  Oral Tablet 5-325 MG  0 days, 0 refills    PARoxetine HCl '40MG'$  Oral Tablet 40 MG  0 days, 0 refills    Rosuvastatin Calcium 40 MG Oral Tablet Take 1 tablet by mouth ONCE daily., 90 days, 2 refills    Sotalol HCl 80 MG Oral Tablet twice a day, 30 days, 2 refills    SUMAtriptan Succinate '100MG'$  Oral Tablet 100 MG  0 days, 0 refills    Tamsulosin HCl 0.'4MG'$  Oral Capsule 0.4 MG  0 days, 0 refills    Topiramate '25MG'$  Oral Tablet 25 MG  Once nightly, 0 days, 0 refills    TraMADol HCl '50MG'$  Oral Tablet 50 MG  0 days, 0 refills    Trulicity A999333 0000000 Subcutaneous Solution Pen-injector once a week 0 days, 0 refills    Vitamin B12 1000 MCG Oral Tablet Extended Release  0 days, 0 refills     PAST MEDICAL/SURGICAL HISTORY  Diagnoses:  First degree A-V block Coronary artery disease CCTA 04/2017: Ca score was 755 with moderate disease in distal LAD and moderate mid LCX, and no significant disesse in RCA  although in mid RCA had step artifact. CARDIAC DONE ON 4/21, high grade lesion mid LAD 80%, and OM1 80%, only LAD lesion had stenr Post-angioplasty Mitral regurgitation Echo at [facility] 04/07/2017:     NORMAL LEFT VENTRICULAR SYSTOLIC FUNCTION WITH AN ESTIMATED EF = 50-55 %  NORMAL RIGHT VENTRICULAR SYSTOLIC FUNCTION  MILD TRICUSPID VALVE INSUFFICIENCY  TRACE MITRAL VALVE INSUFFICIENCY  NO VALVULAR STENOSIS Essential hypertension.   Hyperlipidemia.   Diabetes mellitus.   Vertigo Mild disease of carotids CTA 04/2017 Procedural:   PTCA was performed PCI of Mid LAD 80-90% lesion with DES     SOCIAL HISTORY  Tobacco use:  Not a current smoker and smoking status: Former smoker. Alcohol: Not using alcohol. Habits: Poor exercise habits exercise counseling.     ALLERGIES    ACE Inhibitors     FAMILY HISTORY  Heart disease  Hypertension     REVIEW OF SYSTEMS  Systemic: Feeling poorly (malaise).  No recent weight change. Head: No headache and no sinus pain. Cardiovascular: No chest pain or discomfort, no palpitations, and the heart rate was not fast. Pulmonary: No dyspnea, no cough, and no wheezing. Gastrointestinal: No heartburn.  No nausea, no vomiting, no abdominal pain, and no diarrhea. Neurological: No dizziness, no vertigo, and no fainting. Psychological: No anxiety and no depression.     PHYSICAL FINDINGS    Vitals taken 02/18/2020 01:55 pm  BP-Sitting R  112/60 mmHg 100 - 120/60 - 80  Pulse Rate-Sitting  60 bpm 50 - 100  Temp-Tympanic  97.6 F 96 - 101  Height  72 in 64 - 74  Weight  205 lbs 9.6 oz 121 - 205  Body Mass Index  27.9 kg/m2   Body Surface Area  2.16 m2   Oxygen Saturation  98 % 93 - 100     Vitals taken 02/11/2020 10:00 pm  Height  182.9 cm 162.56 - 187.96     Vitals taken 02/12/2020 08:12 am  Weight  89.268 kg 54.89 - 92.99  Body  Mass Index  26.69 kg/m2      Vitals taken 02/12/2020 04:08 pm  BP  118/69 mmHg 100 - 120/60 - 80  Pulse Rate  64 bpm 50 -  100  Respiration Rate  17 per min 18 - 26  Temp-Oral  36.5 C 35.56 - 38.33  Oxygen Saturation  97 % 93 - 100    General Appearance:  In no acute distress. Lungs:  Clear to auscultation. Cardiovascular: Jugular Venous Distention:  JVD not increased.   JVD not increased. Heart Rate And Rhythm:  Normal.   Normal. Heart Sounds:  Normal. Murmurs:  No murmurs were heard. Carotid Arteries:  No bruit in the carotid artery. Arterial Pulses:  Equal bilaterally and normal. Edema:  Not present. Abdomen: Auscultation:  Bowel sounds were normal. Palpation:  Abdominal non-tender. Neurological:  Oriented to time, place, and person. Right groin no hematoma.     ASSESSMENT   Shortness of breath  Coronary artery disease  Post-angioplasty  Mitral regurgitation  Hypertension  Hyperlipidemia     THERAPY   Continue current medication.  Clinical summary provided to patient.     COUNSELING/EDUCATION   Education and counseling diet     PLAN     Coronary angioplasty status Xarelto 15 MG tablet once a day, 30 days, 2 refills Plavix 75 MG tablet once a day, 30 days, 2 refills     Return to the clinic if condition worsens or new symptoms arise  Follow-up visit [facility], change xarelto to 15 mg daily contine plavix 75 daily. Start cardiac rehab.     HEALTH REMINDERS    Assess Aspirin or Other Antithrombotic satisfied 02/18/2020.    Assess Blood Pressure satisfied 02/18/2020.    Assess BMI satisfied 02/18/2020.    Assess Tobacco Use satisfied 02/18/2020.      Neoma Laming MD  Electronically signed by: Neoma Laming     Date: 02/19/2020 10:44 ACTIVE PROBLEMS    Acute Coronary Syndrome    Angina Pectoris Unstable - Overview: Added automatically from request for surgery [phone]    Athscl heart disease of native coronary artery w/o ang pctrs    Dizziness and giddiness    Essential Hypertension    Nonrheumatic mitral (valve) insufficiency    Paroxysmal atrial  fibrillation    Previous Stent Placement    Weakness     CHIEF COMPLAINT  The Chief Complaint is: Cath Results.     [facility] Follow Up.     HISTORY OF PRESENT ILLNESS  Brent Alvarado is a 77 year old male.   Patient SOB is better, no chest pain.  Allergy list reviewed   Problem list reviewed   Medication reconciliation performed   Medication list reviewed    Dyspnea    Not feeling congested in the chest    No chest pain or discomfort   No palpitations   The heart rate was not fast     CURRENT MEDICATION    amLODIPine Besylate 10 MG Oral Tablet once a day, 90 days, 0 refills    Baclofen '10MG'$  Oral Tablet 10 MG  0 days, 0 refills    Fenofibrate 54 MG Oral Tablet Take 1 tablet by mouth ONCE daily for triglycerides., 90 days, 1 refills    Losartan Potassium 50 MG Oral Tablet TAKE ONE TABLET EVERY MORNING, 90 days, 0 refills    Oxycodone-Acetaminophen 5-'325MG'$  Oral Tablet 5-325 MG  0 days, 0 refills    PARoxetine HCl '40MG'$  Oral Tablet 40 MG  0 days, 0 refills    Rosuvastatin Calcium 40 MG Oral Tablet Take 1 tablet by mouth ONCE daily., 90 days, 2 refills    Sotalol HCl 80 MG Oral Tablet twice a day, 30 days, 2 refills    SUMAtriptan Succinate '100MG'$  Oral Tablet 100 MG  0 days, 0 refills    Tamsulosin HCl 0.'4MG'$  Oral Capsule 0.4 MG  0 days, 0 refills    Topiramate '25MG'$  Oral Tablet 25 MG  Once nightly, 0 days, 0 refills    TraMADol HCl '50MG'$  Oral Tablet 50 MG  0 days, 0 refills    Trulicity A999333 0000000 Subcutaneous Solution Pen-injector once a week 0 days, 0 refills    Vitamin B12 1000 MCG Oral Tablet Extended Release  0 days, 0 refills     PAST MEDICAL/SURGICAL HISTORY  Diagnoses:  First degree A-V block Coronary artery disease CCTA 04/2017: Ca score was 755 with moderate disease in distal LAD and moderate mid LCX, and no significant disesse in RCA although in mid RCA had step artifact. CARDIAC DONE ON 4/21, high grade lesion mid LAD 80%, and OM1 80%, only LAD lesion  had stenr Post-angioplasty Mitral regurgitation Echo at [facility] 04/07/2017:     NORMAL LEFT VENTRICULAR SYSTOLIC FUNCTION WITH AN ESTIMATED EF = 50-55 %  NORMAL RIGHT VENTRICULAR SYSTOLIC FUNCTION  MILD TRICUSPID VALVE INSUFFICIENCY  TRACE MITRAL VALVE INSUFFICIENCY  NO VALVULAR STENOSIS Essential hypertension.   Hyperlipidemia.   Diabetes mellitus.   Vertigo Mild disease of carotids CTA 04/2017 Procedural:   PTCA was performed PCI of Mid LAD 80-90% lesion with DES     SOCIAL HISTORY  Tobacco use:  Not a current smoker and smoking status: Former smoker. Alcohol: Not using alcohol. Habits: Poor exercise habits exercise counseling.     ALLERGIES    ACE Inhibitors     FAMILY HISTORY  Heart disease  Hypertension     REVIEW OF SYSTEMS  Systemic: Feeling poorly (malaise).  No recent weight change. Head: No headache and no sinus pain. Cardiovascular: No chest pain or discomfort, no palpitations, and the heart rate was not fast. Pulmonary: No dyspnea, no cough, and no wheezing. Gastrointestinal: No heartburn.  No nausea, no vomiting, no abdominal pain, and no diarrhea. Neurological: No dizziness, no vertigo, and no fainting. Psychological: No anxiety and no depression.     PHYSICAL FINDINGS    Vitals taken 02/18/2020 01:55 pm  BP-Sitting R  112/60 mmHg 100 - 120/60 - 80  Pulse Rate-Sitting  60 bpm 50 - 100  Temp-Tympanic  97.6 F 96 - 101  Height  72 in 64 - 74  Weight  205 lbs 9.6 oz 121 - 205  Body Mass Index  27.9 kg/m2   Body Surface Area  2.16 m2   Oxygen Saturation  98 % 93 - 100     Vitals taken 02/11/2020 10:00 pm  Height  182.9 cm 162.56 - 187.96     Vitals taken 02/12/2020 08:12 am  Weight  89.268 kg 54.89 - 92.99  Body Mass Index  26.69 kg/m2      Vitals taken 02/12/2020 04:08 pm  BP  118/69 mmHg 100 - 120/60 - 80  Pulse Rate  64 bpm 50 - 100  Respiration Rate  17 per min 18 - 26  Temp-Oral  36.5 C 35.56 - 38.33  Oxygen Saturation  97 % 93 - 100     General Appearance:  In no acute distress. Lungs:  Clear to auscultation. Cardiovascular: Jugular Venous Distention:  JVD not increased.   JVD not increased. Heart Rate And Rhythm:  Normal.   Normal. Heart Sounds:  Normal. Murmurs:  No murmurs were heard. Carotid Arteries:  No bruit in the carotid artery. Arterial Pulses:  Equal bilaterally and normal. Edema:  Not present. Abdomen: Auscultation:  Bowel sounds were normal. Palpation:  Abdominal non-tender. Neurological:  Oriented to time, place, and person. Right groin no hematoma.     ASSESSMENT   Shortness of breath  Coronary artery disease  Post-angioplasty  Mitral regurgitation  Hypertension  Hyperlipidemia     THERAPY   Continue current medication.  Clinical summary provided to patient.     COUNSELING/EDUCATION   Education and counseling diet     PLAN     Coronary angioplasty status Xarelto 15 MG tablet once a day, 30 days, 2 refills Plavix 75 MG tablet once a day, 30 days, 2 refills     Return to the clinic if condition worsens or new symptoms arise  Follow-up visit [facility], change xarelto to 15 mg daily contine plavix 75 daily. Start cardiac rehab.     HEALTH REMINDERS    Assess Aspirin or Other Antithrombotic satisfied 02/18/2020.    Assess Blood Pressure satisfied 02/18/2020.    Assess BMI satisfied 02/18/2020.    Assess Tobacco Use satisfied 02/18/2020.      Neoma Laming MD  Electronically signed by: Neoma Laming     Date: 02/19/2020 10:44 ACTIVE PROBLEMS    Acute Coronary Syndrome    Angina Pectoris Unstable - Overview: Added automatically from request for surgery [phone]    Athscl heart disease of native coronary artery w/o ang pctrs    Dizziness and giddiness    Essential Hypertension    Nonrheumatic mitral (valve) insufficiency    Paroxysmal atrial fibrillation    Previous Stent Placement    Weakness     CHIEF COMPLAINT  The Chief Complaint is: Cath Results.      [facility] Follow Up.     HISTORY OF PRESENT ILLNESS  Brent Alvarado is a 77 year old male.   Patient SOB is better, no chest pain.  Allergy list reviewed   Problem list reviewed   Medication reconciliation performed   Medication list reviewed    Dyspnea    Not feeling congested in the chest    No chest pain or discomfort   No palpitations   The heart rate was not fast     CURRENT MEDICATION    amLODIPine Besylate 10 MG Oral Tablet once a day, 90 days, 0 refills    Baclofen '10MG'$  Oral Tablet 10 MG  0 days, 0 refills    Fenofibrate 54 MG Oral Tablet Take 1 tablet by mouth ONCE daily for triglycerides., 90 days, 1 refills    Losartan Potassium 50 MG Oral Tablet TAKE ONE TABLET EVERY MORNING, 90 days, 0 refills    Oxycodone-Acetaminophen 5-'325MG'$  Oral Tablet 5-325 MG  0 days, 0 refills    PARoxetine HCl '40MG'$  Oral Tablet 40 MG  0 days, 0 refills    Rosuvastatin Calcium 40 MG Oral Tablet Take 1 tablet by mouth ONCE daily., 90 days, 2 refills    Sotalol HCl 80 MG Oral Tablet twice a day, 30 days, 2 refills    SUMAtriptan Succinate '100MG'$  Oral Tablet 100 MG  0 days, 0 refills    Tamsulosin HCl 0.'4MG'$  Oral Capsule 0.4 MG  0 days, 0 refills    Topiramate '25MG'$  Oral Tablet 25 MG  Once  nightly, 0 days, 0 refills    TraMADol HCl '50MG'$  Oral Tablet 50 MG  0 days, 0 refills    Trulicity [id number] Subcutaneous Solution Pen-injector once a week 0 days, 0 refills    Vitamin B12 1000 MCG Oral Tablet Extended Release  0 days, 0 refills     PAST MEDICAL/SURGICAL HISTORY  Diagnoses:  First degree A-V block Coronary artery disease CCTA 04/2017: Ca score was 755 with moderate disease in distal LAD and moderate mid LCX, and no significant disesse in RCA although in mid RCA had step artifact. CARDIAC DONE ON 4/21, high grade lesion mid LAD 80%, and OM1 80%, only LAD lesion had stenr Post-angioplasty Mitral regurgitation Echo at [facility] 04/07/2017:     NORMAL LEFT VENTRICULAR SYSTOLIC FUNCTION  WITH AN ESTIMATED EF = 50-55 %  NORMAL RIGHT VENTRICULAR SYSTOLIC FUNCTION  MILD TRICUSPID VALVE INSUFFICIENCY  TRACE MITRAL VALVE INSUFFICIENCY  NO VALVULAR STENOSIS Essential hypertension.   Hyperlipidemia.   Diabetes mellitus.   Vertigo Mild disease of carotids CTA 04/2017 Procedural:   PTCA was performed PCI of Mid LAD 80-90% lesion with DES     SOCIAL HISTORY  Tobacco use:  Not a current smoker and smoking status: Former smoker. Alcohol: Not using alcohol. Habits: Poor exercise habits exercise counseling.     ALLERGIES    ACE Inhibitors     FAMILY HISTORY  Heart disease  Hypertension     REVIEW OF SYSTEMS  Systemic: Feeling poorly (malaise).  No recent weight change. Head: No headache and no sinus pain. Cardiovascular: No chest pain or discomfort, no palpitations, and the heart rate was not fast. Pulmonary: No dyspnea, no cough, and no wheezing. Gastrointestinal: No heartburn.  No nausea, no vomiting, no abdominal pain, and no diarrhea. Neurological: No dizziness, no vertigo, and no fainting. Psychological: No anxiety and no depression.     PHYSICAL FINDINGS    Vitals taken 02/18/2020 01:55 pm  BP-Sitting R  112/60 mmHg 100 - 120/60 - 80  Pulse Rate-Sitting  60 bpm 50 - 100  Temp-Tympanic  97.6 F 96 - 101  Height  72 in 64 - 74  Weight  205 lbs 9.6 oz 121 - 205  Body Mass Index  27.9 kg/m2   Body Surface Area  2.16 m2   Oxygen Saturation  98 % 93 - 100     Vitals taken 02/11/2020 10:00 pm  Height  182.9 cm 162.56 - 187.96     Vitals taken 02/12/2020 08:12 am  Weight  89.268 kg 54.89 - 92.99  Body Mass Index  26.69 kg/m2      Vitals taken 02/12/2020 04:08 pm  BP  118/69 mmHg 100 - 120/60 - 80  Pulse Rate  64 bpm 50 - 100  Respiration Rate  17 per min 18 - 26  Temp-Oral  36.5 C 35.56 - 38.33  Oxygen Saturation  97 % 93 - 100    General Appearance:  In no acute distress. Lungs:  Clear to auscultation. Cardiovascular: Jugular Venous Distention:   JVD not increased.   JVD not increased. Heart Rate And Rhythm:  Normal.   Normal. Heart Sounds:  Normal. Murmurs:  No murmurs were heard. Carotid Arteries:  No bruit in the carotid artery. Arterial Pulses:  Equal bilaterally and normal. Edema:  Not present. Abdomen: Auscultation:  Bowel sounds were normal. Palpation:  Abdominal non-tender. Neurological:  Oriented to time, place, and person. Right groin no hematoma.     ASSESSMENT   Shortness of breath  Coronary artery disease  Post-angioplasty  Mitral regurgitation  Hypertension  Hyperlipidemia     THERAPY   Continue current medication.  Clinical summary provided to patient.     COUNSELING/EDUCATION   Education and counseling diet     PLAN     Coronary angioplasty status Xarelto 15 MG tablet once a day, 30 days, 2 refills Plavix 75 MG tablet once a day, 30 days, 2 refills     Return to the clinic if condition worsens or new symptoms arise  Follow-up visit [facility], change xarelto to 15 mg daily contine plavix 75 daily. Start cardiac rehab.     HEALTH REMINDERS    Assess Aspirin or Other Antithrombotic satisfied 02/18/2020.    Assess Blood Pressure satisfied 02/18/2020.    Assess BMI satisfied 02/18/2020.    Assess Tobacco Use satisfied 02/18/2020.      Neoma Laming MD  Electronically signed by: Neoma Laming     Date: 02/19/2020 10:44 All other systems are reviewed and negative.    PHYSICAL EXAM: VS:  BP 106/64   Pulse 72   Ht '6\' 2"'$  (1.88 m)   Wt 190 lb (86.2 kg)   SpO2 96%   BMI 24.39 kg/m  , BMI Body mass index is 24.39 kg/m. Last weight:  Wt Readings from Last 3 Encounters:  12/13/22 190 lb (86.2 kg)  05/29/20 200 lb (90.7 kg)  03/04/20 197 lb 9.6 oz (89.6 kg)     Physical Exam Vitals reviewed.  Constitutional:      Appearance: Normal appearance. He is normal weight.  HENT:     Head: Normocephalic.     Nose: Nose normal.     Mouth/Throat:     Mouth: Mucous membranes are  moist.  Eyes:     Pupils: Pupils are equal, round, and reactive to light.  Cardiovascular:     Rate and Rhythm: Normal rate and regular rhythm.     Pulses: Normal pulses.     Heart sounds: Normal heart sounds.  Pulmonary:     Effort: Pulmonary effort is normal.  Abdominal:     General: Abdomen is flat. Bowel sounds are normal.  Musculoskeletal:        General: Normal range of motion.     Cervical back: Normal range of motion.  Skin:    General: Skin is warm.  Neurological:     General: No focal deficit present.     Mental Status: He is alert.  Psychiatric:        Mood and Affect: Mood normal.       EKG:   Recent Labs: No results found for requested labs within last 365 days.    Lipid Panel No results found for: "CHOL", "TRIG", "HDL", "CHOLHDL", "VLDL", "LDLCALC", "LDLDIRECT"    Other studies Reviewed: Additional studies/ records that were reviewed today include:  Review of the above records demonstrates:       No data to display            ASSESSMENT AND PLAN:    ICD-10-CM   1. ACS (acute coronary syndrome) (HCC)  I24.9     2. Essential hypertension  I10     3. Unstable angina (HCC)  I20.0     4. S/P drug eluting coronary stent placement  Z95.5        Problem List Items Addressed This Visit       Cardiovascular and Mediastinum   Unstable angina [facility])   ACS (acute coronary syndrome) ([place]) - Primary  Doing well      Essential hypertension     Other   S/P drug eluting coronary stent placement       Disposition:   Return in about 3 months (around 03/13/2023).    Total time spent: 30 minutes  Signed,  Neoma Laming, MD  12/13/2022 12:22 PM    [place]

## 2023-02-14 ENCOUNTER — Other Ambulatory Visit: Payer: Self-pay | Admitting: Cardiovascular Disease

## 2023-02-22 ENCOUNTER — Other Ambulatory Visit: Payer: Self-pay | Admitting: Cardiovascular Disease

## 2023-02-23 ENCOUNTER — Other Ambulatory Visit: Payer: Self-pay | Admitting: Cardiovascular Disease

## 2023-03-14 ENCOUNTER — Ambulatory Visit: Payer: Medicare Other | Admitting: Cardiovascular Disease

## 2023-03-14 ENCOUNTER — Encounter: Payer: Self-pay | Admitting: Cardiovascular Disease

## 2023-03-14 VITALS — BP 122/78 | HR 68 | Ht 72.0 in | Wt 198.8 lb

## 2023-03-14 DIAGNOSIS — I200 Unstable angina: Secondary | ICD-10-CM | POA: Diagnosis not present

## 2023-03-14 DIAGNOSIS — Z955 Presence of coronary angioplasty implant and graft: Secondary | ICD-10-CM

## 2023-03-14 DIAGNOSIS — I10 Essential (primary) hypertension: Secondary | ICD-10-CM | POA: Diagnosis not present

## 2023-03-14 DIAGNOSIS — I249 Acute ischemic heart disease, unspecified: Secondary | ICD-10-CM

## 2023-03-14 NOTE — Progress Notes (Signed)
Cardiology Office Note   Date:  03/14/2023   ID:  Brent Alvarado, Brent Alvarado 08/24/1946, MRN [id number]  PCP:  Danella Penton, MD  Cardiologist:  Adrian Blackwater, MD      History of Present Illness: Brent Alvarado is a 77 y.o. male who presents for No chief complaint on file.   Occasional palpitation  Palpitations  This is a chronic problem. The current episode started more than 1 year ago. The problem has been unchanged.      Past Medical History:  Diagnosis Date   Anxiety    BPH (benign prostatic hyperplasia)    Depression    Diabetes mellitus without complication (HCC)    Controlled poorly with metformin   Diverticulitis    Dyspnea    Dysrhythmia    GERD (gastroesophageal reflux disease)    Hyperlipidemia    Hypertension      Past Surgical History:  Procedure Laterality Date   COLONOSCOPY WITH PROPOFOL N/A 08/28/2015   Procedure: COLONOSCOPY WITH PROPOFOL;  Surgeon: Scot Jun, MD;  Location: [facility] ENDOSCOPY;  Service: Endoscopy;  Laterality: N/A;   CORONARY STENT INTERVENTION N/A 02/11/2020   Procedure: CORONARY STENT INTERVENTION;  Surgeon: Alwyn Pea, MD;  Location: [facility] INVASIVE CV LAB;  Service: Cardiovascular;  Laterality: N/A;   ESOPHAGOGASTRODUODENOSCOPY (EGD) WITH PROPOFOL N/A 08/28/2015   Procedure: ESOPHAGOGASTRODUODENOSCOPY (EGD) WITH PROPOFOL;  Surgeon: Scot Jun, MD;  Location: [facility] ENDOSCOPY;  Service: Endoscopy;  Laterality: N/A;   LEFT HEART CATH AND CORONARY ANGIOGRAPHY Left 02/11/2020   Procedure: LEFT HEART CATH AND CORONARY ANGIOGRAPHY;  Surgeon: Laurier Nancy, MD;  Location: [facility] INVASIVE CV LAB;  Service: Cardiovascular;  Laterality: Left;   SAVORY DILATION N/A 08/28/2015   Procedure: SAVORY DILATION;  Surgeon: Scot Jun, MD;  Location: [facility] ENDOSCOPY;  Service: Endoscopy;  Laterality: N/A;     Current Outpatient Medications  Medication Sig Dispense Refill   clopidogrel (PLAVIX) 75 MG tablet TAKE 1 TABLET BY MOUTH  EVERY DAY. 90 tablet 0   Dulaglutide (TRULICITY) 0.75 MG/0.5ML SOPN Inject into the skin once a week.     fenofibrate 54 MG tablet TAKE ONE TABLET EVERY DAY FOR TRIGLYCERIDES 90 tablet 3   isosorbide mononitrate (IMDUR) 30 MG 24 hr tablet TAKE 1 TABLET BY MOUTH DAILY 90 tablet 0   losartan (COZAAR) 50 MG tablet Take 1 tablet (50 mg total) by mouth every morning. 90 tablet 0   nitroGLYCERIN (NITROSTAT) 0.4 MG SL tablet Place 1 tablet (0.4 mg total) under the tongue every 5 (five) minutes x 3 doses as needed for chest pain. 20 tablet 1   PARoxetine (PAXIL) 40 MG tablet Take 40 mg by mouth every morning.     polyethylene glycol (MIRALAX / GLYCOLAX) 17 g packet Take 17 g by mouth daily.     Rivaroxaban (XARELTO) 15 MG TABS tablet TAKE ONE TABLET EVERY DAY 90 tablet 1   rosuvastatin (CRESTOR) 40 MG tablet Take 1 tablet by mouth daily.     sotalol (BETAPACE) 80 MG tablet Take 80 mg by mouth 2 (two) times daily.     furosemide (LASIX) 20 MG tablet Take 20 mg by mouth daily.      tamsulosin (FLOMAX) 0.4 MG CAPS capsule Take 0.4 mg by mouth.     No current facility-administered medications for this visit.   Facility-Administered Medications Ordered in Other Visits  Medication Dose Route Frequency Provider Last Rate Last Admin   0.9 %  sodium chloride infusion  250 mL Intravenous PRN Adrian Blackwater A, MD       sodium chloride flush (NS) 0.9 % injection 3 mL  3 mL Intravenous Q12H Adrian Blackwater A, MD       sodium chloride flush (NS) 0.9 % injection 3 mL  3 mL Intravenous PRN Adrian Blackwater A, MD       sodium chloride flush (NS) 0.9 % injection 3 mL  3 mL Intravenous Q12H Laurier Nancy, MD        Allergies:   Ace inhibitors    Social History:   reports that he has never smoked. He has never used smokeless tobacco. He reports that he does not drink alcohol and does not use drugs.   Family History:  family history is not on file.    ROS:     Review of Systems  Constitutional: Negative.   HENT:  Negative.    Eyes: Negative.   Respiratory: Negative.    Cardiovascular:  Positive for palpitations.  Gastrointestinal: Negative.   Genitourinary: Negative.   Musculoskeletal: Negative.   Skin: Negative.   Neurological: Negative.   Endo/Heme/Allergies: Negative.   Psychiatric/Behavioral: Negative.    All other systems reviewed and are negative.     All other systems are reviewed and negative.    PHYSICAL EXAM: VS:  BP 122/78   Pulse 68   Ht 6' (1.829 m)   Wt 198 lb 12.8 oz (90.2 kg)   SpO2 97%   BMI 26.96 kg/m  , BMI Body mass index is 26.96 kg/m. Last weight:  Wt Readings from Last 3 Encounters:  03/14/23 198 lb 12.8 oz (90.2 kg)  12/13/22 190 lb (86.2 kg)  05/29/20 200 lb (90.7 kg)     Physical Exam Vitals reviewed.  Constitutional:      Appearance: Normal appearance. He is normal weight.  HENT:     Head: Normocephalic.     Nose: Nose normal.     Mouth/Throat:     Mouth: Mucous membranes are moist.  Eyes:     Pupils: Pupils are equal, round, and reactive to light.  Cardiovascular:     Rate and Rhythm: Normal rate and regular rhythm.     Pulses: Normal pulses.     Heart sounds: Normal heart sounds.  Pulmonary:     Effort: Pulmonary effort is normal.  Abdominal:     General: Abdomen is flat. Bowel sounds are normal.  Musculoskeletal:        General: Normal range of motion.     Cervical back: Normal range of motion.  Skin:    General: Skin is warm.  Neurological:     General: No focal deficit present.     Mental Status: He is alert.  Psychiatric:        Mood and Affect: Mood normal.       EKG:   Recent Labs: No results found for requested labs within last 365 days.    Lipid Panel No results found for: "CHOL", "TRIG", "HDL", "CHOLHDL", "VLDL", "LDLCALC", "LDLDIRECT"    REASON FOR VISIT  Visit for: Echocardiogram/Atherosclerotic heart disease  Sex:   Male        wt=  194  lbs.  BP= 126/72  Height=  72  inches.        TESTS   Imaging: Echocardiogram:  An echocardiogram in (2-d) mode was performed and in Doppler mode with color flow velocity mapping was performed. The aortic valve cusps are abnormal 2  cm, flow velocity 0.9  m/s, and systolic calculated mean flow gradient 2  mmHg. Mitral valve diastolic peak flow velocity E 0.4    m/s and E/A ratio 0.6. Aortic root diameter 3.2   cm. The LVOT internal diameter 2.1  cm and flow velocity was abnormal 0.7   m/s. LV systolic dimension 3.5  cm, diastolic 4.4  cm, posterior wall thickness 0.9   cm, fractional shortening 20 %, and EF 52 %. IVS thickness 1.2   cm. LA dimension 3.3 cm  RIGHT atrium=  16.3  cm2. Mitral Valve =  Ea= 6.7  DT= 514 msec. Mitral Valve is Normal. Tricuspid Valve =  TR jet V=   2.2   RAP= 5  RVSP=  25   mmHg. Aortic Valve is Normal. Pulmonic Valve is Normal. Tricuspid Valve has Mild Regurgitation.     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     THERAPY   Referring physician: Laurier Nancy  Sonographer: Lenor Derrick.      Adrian Blackwater MD  Electronically signed by: Adrian Blackwater     Date: 06/19/2021 09:38 TESTS                                                                                          [facility] [address] [place], [place] [address] [phone] STUDY:  Gated Stress / Rest Myocardial Perfusion Imaging Tomographic (SPECT) Including attenuation correction Wall Motion, Left Ventricular  Ejection Fraction By Gated Technique.Treadmill Stress Test. SEX: Male   WEIGHT: 195 lbs   HEIGHT: 72 in       ARMS UP: YES/NO                                                                        REFERRING PHYSICIAN: Dr.Sigourney Portillo [place] Flakes  INDICATION FOR STUDY:  R07.9                                                                                                                                                                                                              TECHNIQUE:  Approximately 20 minutes following the intravenous administration of 10.0 mCi of Tc-30m Sestamibi after stress testing in a reclined supine position with arms above their head if able to do so, gated SPECT imaging of the heart was performed. After about a 2hr break, the patient was injected intravenously with 32.3 mCi of Tc-33m Sestamibi.  Approximately 45 minutes later in the same position as stress imaging SPECT rest imaging of the heart was performed.  STRESS BY:  Adrian Blackwater, MD PROTOCOL: Smitty Cords                                                                                         MAX PRED HR: 144                     85%: 122               75%: 108                                                                                                                   RESTING BP: 104/60  RESTING HR: 70  PEAK BP: 146/70   PEAK HR: 109 (76%)  EXERCISE DURATION:  5:01                                            METS:  5.3    REASON FOR TEST TERMINATION: Leg pain. SOB.                                                                                                                                  SYMPTOMS: Leg pain. SOB.  [facility] TREADMILL SCORE:                                        RISK:   Low                                                                                                                                                                                                           EKG RESULTS: 5                                                             IMAGE QUALITY: Good  PERFUSION/WALL MOTION FINDINGS:  EF = 78%. Moderate size and intensity fixed basal and mid inferior wall defects, normal wall motion.                                                                          IMPRESSION: Above defect most likely due to diaphragmatic attenuation artifact with normal LVEF, probable normal stress test.                                                                                                                                                                                                                                                                                         Adrian Blackwater, MD Stress Interpreting Physician / Nuclear Interpreting Physician                         Adrian Blackwater MD  Electronically signed by: Adrian Blackwater     Date: 11/19/2021 10:45 REASON FOR VISIT  Referred by Dr.Pinki Rottman [place] Flakes.        TESTS  Imaging: Computed Tomographic Angiography:  Cardiac multidetector CT was performed paying particular attention to the coronary arteries for the diagnosis of: CAD. Diagnostic Drugs:  Administered iohexol (Omnipaque) through an antecubital vein and images from the examination were analyzed for the presence and extent of coronary artery disease, using 3D image processing software. 100 mL of non-ionic contrast (Omnipaque) was used.         TEST CONCLUSIONS  Quality of study: Suboptimal/Poor.  1-Calcium score: 1098.4  Unable to evaluatie coronaries. Consider cardiac Cath.      Adrian Blackwater MD  Electronically signed by: Adrian Blackwater     Date: 01/22/2020 14:20 TESTS  [facility] [address] [place], [place] [address] [phone] STUDY:  Gated Stress / Rest Myocardial Perfusion Imaging Tomographic (SPECT) Including attenuation correction Wall Motion, Left Ventricular Ejection Fraction By Gated Technique.Treadmill Stress Test. SEX: Male   WEIGHT: 195 lbs   HEIGHT: 72 in       ARMS UP: YES/NO                                                                        REFERRING PHYSICIAN: Dr.Nirvaan Frett [place] Flakes                                                                                                                                                                                                 INDICATION FOR STUDY:  R07.9                                                                                                                                                                                                              TECHNIQUE:  Approximately 20 minutes following the intravenous administration of 10.0 mCi of Tc-4m Sestamibi after stress testing in a reclined supine position with arms above their head if able to do so, gated SPECT imaging of the heart was performed. After about a 2hr break, the patient was injected intravenously with 32.3 mCi of Tc-29m Sestamibi.  Approximately 45 minutes later in the same  position as stress imaging SPECT rest imaging of the heart was performed.  STRESS BY:  Adrian Blackwater, MD PROTOCOL: Smitty Cords                                                                                         MAX PRED HR: 144                     85%: 122               75%: 108                                                                                                                    RESTING BP: 104/60  RESTING HR: 70  PEAK BP: 146/70   PEAK HR: 109 (76%)                                                                    EXERCISE DURATION:  5:01                                            METS:  5.3    REASON FOR TEST TERMINATION: Leg pain. SOB.                                                                                                                                  SYMPTOMS: Leg pain. SOB.  [facility] TREADMILL SCORE:                                        RISK:  Low  EKG RESULTS: 5                                                             IMAGE QUALITY: Good                                                                                                                                                                                                                                                                                                                                  PERFUSION/WALL MOTION FINDINGS:  EF = 78%. Moderate size and intensity fixed basal and mid inferior wall defects, normal wall motion.                                                                          IMPRESSION: Above defect most likely due to diaphragmatic attenuation artifact with normal LVEF, probable normal stress test.  Adrian Blackwater, MD Stress Interpreting Physician / Nuclear Interpreting Physician                         Adrian Blackwater MD   Electronically signed by: Adrian Blackwater     Date: 11/19/2021 10:45 Other studies Reviewed: Additional studies/ records that were reviewed today include:  Review of the above records demonstrates:       No data to display            ASSESSMENT AND PLAN:    ICD-10-CM   1. S/P drug eluting coronary stent placement  Z95.5     2. ACS (acute coronary syndrome) (HCC)  I24.9     3. Essential hypertension  I10    stable    4. Unstable angina (HCC)  I20.0        Problem List Items Addressed This Visit       Cardiovascular and Mediastinum   Unstable angina [facility])   ACS (acute coronary syndrome) (HCC)   Essential hypertension     Other   S/P drug eluting coronary stent placement - Primary    On 4/21 had cardiac cath at [facility], and had 80% mid LAD needing PCI/Stent and OM1  80% treated medically. But doing well , stress test last year ok.          Disposition:   No follow-ups on file.    Total time spent: 30 minutes  Signed,  Adrian Blackwater, MD  03/14/2023 9:53 AM    [facility]

## 2023-03-14 NOTE — Assessment & Plan Note (Signed)
On 4/21 had cardiac cath at [facility], and had 80% mid LAD needing PCI/Stent and OM1  80% treated medically. But doing well , stress test last year ok.

## 2023-04-08 ENCOUNTER — Other Ambulatory Visit: Payer: Self-pay | Admitting: Cardiovascular Disease

## 2023-05-13 ENCOUNTER — Other Ambulatory Visit: Payer: Self-pay | Admitting: Cardiovascular Disease

## 2023-05-17 ENCOUNTER — Other Ambulatory Visit: Payer: Self-pay | Admitting: Cardiovascular Disease

## 2023-05-18 ENCOUNTER — Other Ambulatory Visit: Payer: Self-pay | Admitting: Internal Medicine

## 2023-05-18 MED ORDER — ROSUVASTATIN CALCIUM 40 MG PO TABS: 40.0000 mg | ORAL_TABLET | Freq: Every day | ORAL | 3 refills | Status: AC

## 2023-05-30 ENCOUNTER — Other Ambulatory Visit: Payer: Self-pay | Admitting: Cardiovascular Disease

## 2023-06-13 ENCOUNTER — Ambulatory Visit: Payer: Medicare Other | Admitting: Cardiovascular Disease

## 2023-06-13 ENCOUNTER — Encounter: Payer: Self-pay | Admitting: Cardiovascular Disease

## 2023-06-13 VITALS — BP 101/61 | HR 63 | Ht 73.0 in | Wt 196.6 lb

## 2023-06-13 DIAGNOSIS — Z955 Presence of coronary angioplasty implant and graft: Secondary | ICD-10-CM

## 2023-06-13 DIAGNOSIS — I480 Paroxysmal atrial fibrillation: Secondary | ICD-10-CM

## 2023-06-13 DIAGNOSIS — R0602 Shortness of breath: Secondary | ICD-10-CM | POA: Diagnosis not present

## 2023-06-13 DIAGNOSIS — I10 Essential (primary) hypertension: Secondary | ICD-10-CM | POA: Diagnosis not present

## 2023-06-13 DIAGNOSIS — R42 Dizziness and giddiness: Secondary | ICD-10-CM

## 2023-06-13 DIAGNOSIS — E782 Mixed hyperlipidemia: Secondary | ICD-10-CM | POA: Diagnosis not present

## 2023-06-13 NOTE — Patient Instructions (Signed)
Stop amlodapine

## 2023-06-13 NOTE — Progress Notes (Signed)
Cardiology Office Note   Date:  06/13/2023   ID:  Zalman, Ganser 04/25/1946, MRN [id number]  PCP:  Danella Penton, MD  Cardiologist:  Adrian Blackwater, MD      History of Present Illness: Brent Alvarado is a 77 y.o. male who presents for  Chief Complaint  Patient presents with   Follow-up    Feels dizzy  Dizziness This is a chronic problem. The current episode started more than 1 year ago. The problem has been unchanged. The symptoms are aggravated by standing.      Past Medical History:  Diagnosis Date   Anxiety    BPH (benign prostatic hyperplasia)    Depression    Diabetes mellitus without complication (HCC)    Controlled poorly with metformin   Diverticulitis    Dyspnea    Dysrhythmia    GERD (gastroesophageal reflux disease)    Hyperlipidemia    Hypertension      Past Surgical History:  Procedure Laterality Date   COLONOSCOPY WITH PROPOFOL N/A 08/28/2015   Procedure: COLONOSCOPY WITH PROPOFOL;  Surgeon: Scot Jun, MD;  Location: [facility] ENDOSCOPY;  Service: Endoscopy;  Laterality: N/A;   CORONARY STENT INTERVENTION N/A 02/11/2020   Procedure: CORONARY STENT INTERVENTION;  Surgeon: Alwyn Pea, MD;  Location: [facility] INVASIVE CV LAB;  Service: Cardiovascular;  Laterality: N/A;   ESOPHAGOGASTRODUODENOSCOPY (EGD) WITH PROPOFOL N/A 08/28/2015   Procedure: ESOPHAGOGASTRODUODENOSCOPY (EGD) WITH PROPOFOL;  Surgeon: Scot Jun, MD;  Location: [facility] ENDOSCOPY;  Service: Endoscopy;  Laterality: N/A;   LEFT HEART CATH AND CORONARY ANGIOGRAPHY Left 02/11/2020   Procedure: LEFT HEART CATH AND CORONARY ANGIOGRAPHY;  Surgeon: Laurier Nancy, MD;  Location: [facility] INVASIVE CV LAB;  Service: Cardiovascular;  Laterality: Left;   SAVORY DILATION N/A 08/28/2015   Procedure: SAVORY DILATION;  Surgeon: Scot Jun, MD;  Location: [facility] ENDOSCOPY;  Service: Endoscopy;  Laterality: N/A;     Current Outpatient Medications  Medication Sig Dispense Refill    clopidogrel (PLAVIX) 75 MG tablet Take 1 tablet (75 mg total) by mouth daily. 90 tablet 0   Dulaglutide (TRULICITY) 0.75 MG/0.5ML SOPN Inject into the skin once a week.     fenofibrate 54 MG tablet TAKE ONE TABLET EVERY DAY FOR TRIGLYCERIDES 90 tablet 3   furosemide (LASIX) 20 MG tablet Take 20 mg by mouth daily.      isosorbide mononitrate (IMDUR) 30 MG 24 hr tablet TAKE 1 TABLET BY MOUTH DAILY 90 tablet 0   losartan (COZAAR) 50 MG tablet Take 1 tablet (50 mg total) by mouth every morning. 90 tablet 0   nitroGLYCERIN (NITROSTAT) 0.4 MG SL tablet Place 1 tablet (0.4 mg total) under the tongue every 5 (five) minutes x 3 doses as needed for chest pain. 20 tablet 1   PARoxetine (PAXIL) 40 MG tablet Take 40 mg by mouth every morning.     polyethylene glycol (MIRALAX / GLYCOLAX) 17 g packet Take 17 g by mouth daily.     rosuvastatin (CRESTOR) 40 MG tablet Take 1 tablet (40 mg total) by mouth daily. 90 tablet 3   sotalol (BETAPACE) 80 MG tablet TAKE ONE (1) TABLET BY MOUTH TWO TIMES PER DAY 180 tablet 3   tamsulosin (FLOMAX) 0.4 MG CAPS capsule Take 0.4 mg by mouth.     XARELTO 15 MG TABS tablet TAKE ONE TABLET EVERY DAY 90 tablet 1   No current facility-administered medications for this visit.   Facility-Administered Medications Ordered in Other Visits  Medication Dose Route Frequency Provider Last Rate Last Admin   0.9 %  sodium chloride infusion  250 mL Intravenous PRN Adrian Blackwater A, MD       sodium chloride flush (NS) 0.9 % injection 3 mL  3 mL Intravenous Q12H Adrian Blackwater A, MD       sodium chloride flush (NS) 0.9 % injection 3 mL  3 mL Intravenous PRN Adrian Blackwater A, MD       sodium chloride flush (NS) 0.9 % injection 3 mL  3 mL Intravenous Q12H Laurier Nancy, MD        Allergies:   Ace inhibitors    Social History:   reports that he has never smoked. He has never used smokeless tobacco. He reports that he does not drink alcohol and does not use drugs.   Family History:  family  history is not on file.    ROS:     Review of Systems  Constitutional: Negative.   HENT: Negative.    Eyes: Negative.   Respiratory: Negative.    Gastrointestinal: Negative.   Genitourinary: Negative.   Musculoskeletal: Negative.   Skin: Negative.   Neurological:  Positive for dizziness.  Endo/Heme/Allergies: Negative.   Psychiatric/Behavioral: Negative.    All other systems reviewed and are negative.     All other systems are reviewed and negative.    PHYSICAL EXAM: VS:  BP 101/61   Pulse 63   Ht 6\' 1"  (1.854 m)   Wt 196 lb 9.6 oz (89.2 kg)   SpO2 97%   BMI 25.94 kg/m  , BMI Body mass index is 25.94 kg/m. Last weight:  Wt Readings from Last 3 Encounters:  06/13/23 196 lb 9.6 oz (89.2 kg)  03/14/23 198 lb 12.8 oz (90.2 kg)  12/13/22 190 lb (86.2 kg)     Physical Exam Vitals reviewed.  Constitutional:      Appearance: Normal appearance. He is normal weight.  HENT:     Head: Normocephalic.     Nose: Nose normal.     Mouth/Throat:     Mouth: Mucous membranes are moist.  Eyes:     Pupils: Pupils are equal, round, and reactive to light.  Cardiovascular:     Rate and Rhythm: Normal rate and regular rhythm.     Pulses: Normal pulses.     Heart sounds: Normal heart sounds.  Pulmonary:     Effort: Pulmonary effort is normal.  Abdominal:     General: Abdomen is flat. Bowel sounds are normal.  Musculoskeletal:        General: Normal range of motion.     Cervical back: Normal range of motion.  Skin:    General: Skin is warm.  Neurological:     General: No focal deficit present.     Mental Status: He is alert.  Psychiatric:        Mood and Affect: Mood normal.       EKG:   Recent Labs: No results found for requested labs within last 365 days.    Lipid Panel No results found for: "CHOL", "TRIG", "HDL", "CHOLHDL", "VLDL", "LDLCALC", "LDLDIRECT"    Other studies Reviewed: Additional studies/ records that were reviewed today include:  Review of the  above records demonstrates:       No data to display            ASSESSMENT AND PLAN:    ICD-10-CM   1. Essential hypertension  I10     2. 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 SOB (shortness of breath)  R06.02     4. Dizziness  R42    will stop amlodapine    5. Mixed hyperlipidemia  E78.2     6. Primary hypertension  I10    stop amlodapine 10, as bp 90/60    7. Paroxysmal atrial fibrillation (HCC)  I48.0    occasional palpitation       Problem List Items Addressed This Visit       Cardiovascular and Mediastinum   Essential hypertension - Primary     Other   S/P drug eluting coronary stent placement   Other Visit Diagnoses     SOB (shortness of breath)       Dizziness       will stop amlodapine   Mixed hyperlipidemia       Primary hypertension       stop amlodapine 10, as bp 90/60   Paroxysmal atrial fibrillation (HCC)       occasional palpitation          Disposition:   Return in about 4 weeks (around 07/11/2023).    Total time spent: 35 minutes  Signed,  Adrian Blackwater, MD  06/13/2023 9:36 AM    [facility]

## 2023-07-18 ENCOUNTER — Encounter: Payer: Self-pay | Admitting: Cardiovascular Disease

## 2023-07-18 ENCOUNTER — Ambulatory Visit: Payer: Medicare Other | Admitting: Cardiovascular Disease

## 2023-07-18 VITALS — BP 120/70 | HR 68 | Ht 73.0 in | Wt 196.0 lb

## 2023-07-18 DIAGNOSIS — E782 Mixed hyperlipidemia: Secondary | ICD-10-CM | POA: Diagnosis not present

## 2023-07-18 DIAGNOSIS — R42 Dizziness and giddiness: Secondary | ICD-10-CM

## 2023-07-18 DIAGNOSIS — I10 Essential (primary) hypertension: Secondary | ICD-10-CM

## 2023-07-18 DIAGNOSIS — I480 Paroxysmal atrial fibrillation: Secondary | ICD-10-CM | POA: Diagnosis not present

## 2023-07-18 DIAGNOSIS — R0602 Shortness of breath: Secondary | ICD-10-CM | POA: Diagnosis not present

## 2023-07-18 DIAGNOSIS — Z955 Presence of coronary angioplasty implant and graft: Secondary | ICD-10-CM

## 2023-07-18 NOTE — Progress Notes (Signed)
Cardiology Office Note   Date:  07/18/2023   ID:  Brent Alvarado, Brent Alvarado 06/13/1946, MRN [id number]  PCP:  Danella Penton, MD  Cardiologist:  Adrian Blackwater, MD      History of Present Illness: Brent Alvarado is a 77 y.o. male who presents for  Chief Complaint  Patient presents with   Follow-up    1 mo f/u    Patient is short of breath  Shortness of Breath This is a chronic problem. The current episode started more than 1 year ago. The problem has been waxing and waning.      Past Medical History:  Diagnosis Date   Anxiety    BPH (benign prostatic hyperplasia)    Depression    Diabetes mellitus without complication (HCC)    Controlled poorly with metformin   Diverticulitis    Dyspnea    Dysrhythmia    GERD (gastroesophageal reflux disease)    Hyperlipidemia    Hypertension      Past Surgical History:  Procedure Laterality Date   COLONOSCOPY WITH PROPOFOL N/A 08/28/2015   Procedure: COLONOSCOPY WITH PROPOFOL;  Surgeon: Scot Jun, MD;  Location: [facility] ENDOSCOPY;  Service: Endoscopy;  Laterality: N/A;   CORONARY STENT INTERVENTION N/A 02/11/2020   Procedure: CORONARY STENT INTERVENTION;  Surgeon: Alwyn Pea, MD;  Location: [facility] INVASIVE CV LAB;  Service: Cardiovascular;  Laterality: N/A;   ESOPHAGOGASTRODUODENOSCOPY (EGD) WITH PROPOFOL N/A 08/28/2015   Procedure: ESOPHAGOGASTRODUODENOSCOPY (EGD) WITH PROPOFOL;  Surgeon: Scot Jun, MD;  Location: [facility] ENDOSCOPY;  Service: Endoscopy;  Laterality: N/A;   LEFT HEART CATH AND CORONARY ANGIOGRAPHY Left 02/11/2020   Procedure: LEFT HEART CATH AND CORONARY ANGIOGRAPHY;  Surgeon: Laurier Nancy, MD;  Location: [facility] INVASIVE CV LAB;  Service: Cardiovascular;  Laterality: Left;   SAVORY DILATION N/A 08/28/2015   Procedure: SAVORY DILATION;  Surgeon: Scot Jun, MD;  Location: [facility] ENDOSCOPY;  Service: Endoscopy;  Laterality: N/A;     Current Outpatient Medications  Medication Sig Dispense Refill    clopidogrel (PLAVIX) 75 MG tablet Take 1 tablet (75 mg total) by mouth daily. 90 tablet 0   Dulaglutide (TRULICITY) 0.75 MG/0.5ML SOPN Inject into the skin once a week.     fenofibrate 54 MG tablet TAKE ONE TABLET EVERY DAY FOR TRIGLYCERIDES 90 tablet 3   furosemide (LASIX) 20 MG tablet Take 20 mg by mouth daily.      isosorbide mononitrate (IMDUR) 30 MG 24 hr tablet TAKE 1 TABLET BY MOUTH DAILY 90 tablet 0   losartan (COZAAR) 50 MG tablet Take 1 tablet (50 mg total) by mouth every morning. 90 tablet 0   nitroGLYCERIN (NITROSTAT) 0.4 MG SL tablet Place 1 tablet (0.4 mg total) under the tongue every 5 (five) minutes x 3 doses as needed for chest pain. 20 tablet 1   PARoxetine (PAXIL) 40 MG tablet Take 40 mg by mouth every morning.     polyethylene glycol (MIRALAX / GLYCOLAX) 17 g packet Take 17 g by mouth daily.     rosuvastatin (CRESTOR) 40 MG tablet Take 1 tablet (40 mg total) by mouth daily. 90 tablet 3   sotalol (BETAPACE) 80 MG tablet TAKE ONE (1) TABLET BY MOUTH TWO TIMES PER DAY 180 tablet 3   tamsulosin (FLOMAX) 0.4 MG CAPS capsule Take 0.4 mg by mouth.     XARELTO 15 MG TABS tablet TAKE ONE TABLET EVERY DAY 90 tablet 1   No current facility-administered medications for this visit.  Facility-Administered Medications Ordered in Other Visits  Medication Dose Route Frequency Provider Last Rate Last Admin   0.9 %  sodium chloride infusion  250 mL Intravenous PRN Adrian Blackwater A, MD       sodium chloride flush (NS) 0.9 % injection 3 mL  3 mL Intravenous Q12H Adrian Blackwater A, MD       sodium chloride flush (NS) 0.9 % injection 3 mL  3 mL Intravenous PRN Adrian Blackwater A, MD       sodium chloride flush (NS) 0.9 % injection 3 mL  3 mL Intravenous Q12H Laurier Nancy, MD        Allergies:   Ace inhibitors    Social History:   reports that he has never smoked. He has never used smokeless tobacco. He reports that he does not drink alcohol and does not use drugs.   Family History:  family  history is not on file.    ROS:     Review of Systems  Constitutional: Negative.   HENT: Negative.    Eyes: Negative.   Respiratory:  Positive for shortness of breath.   Gastrointestinal: Negative.   Genitourinary: Negative.   Musculoskeletal: Negative.   Skin: Negative.   Neurological: Negative.   Endo/Heme/Allergies: Negative.   Psychiatric/Behavioral: Negative.    All other systems reviewed and are negative.     All other systems are reviewed and negative.    PHYSICAL EXAM: VS:  BP 120/70   Pulse 68   Ht 6\' 1"  (1.854 m)   Wt 196 lb (88.9 kg)   SpO2 98%   BMI 25.86 kg/m  , BMI Body mass index is 25.86 kg/m. Last weight:  Wt Readings from Last 3 Encounters:  07/18/23 196 lb (88.9 kg)  06/13/23 196 lb 9.6 oz (89.2 kg)  03/14/23 198 lb 12.8 oz (90.2 kg)     Physical Exam Vitals reviewed.  Constitutional:      Appearance: Normal appearance. He is normal weight.  HENT:     Head: Normocephalic.     Nose: Nose normal.     Mouth/Throat:     Mouth: Mucous membranes are moist.  Eyes:     Pupils: Pupils are equal, round, and reactive to light.  Cardiovascular:     Rate and Rhythm: Normal rate and regular rhythm.     Pulses: Normal pulses.     Heart sounds: Normal heart sounds.  Pulmonary:     Effort: Pulmonary effort is normal.  Abdominal:     General: Abdomen is flat. Bowel sounds are normal.  Musculoskeletal:        General: Normal range of motion.     Cervical back: Normal range of motion.  Skin:    General: Skin is warm.  Neurological:     General: No focal deficit present.     Mental Status: He is alert.  Psychiatric:        Mood and Affect: Mood normal.       EKG:   Recent Labs: No results found for requested labs within last 365 days.    Lipid Panel No results found for: "CHOL", "TRIG", "HDL", "CHOLHDL", "VLDL", "LDLCALC", "LDLDIRECT"    Other studies Reviewed: Additional studies/ records that were reviewed today include:  Review of  the above records demonstrates:       No data to display            ASSESSMENT AND PLAN:    ICD-10-CM   1. SOB (shortness of breath)  R06.02 MYOCARDIAL PERFUSION IMAGING    PCV ECHOCARDIOGRAM COMPLETE   stress test, echo as had PCI    2. Paroxysmal atrial fibrillation (HCC)  I48.0 MYOCARDIAL PERFUSION IMAGING    PCV ECHOCARDIOGRAM COMPLETE    3. Primary hypertension  I10 MYOCARDIAL PERFUSION IMAGING    PCV ECHOCARDIOGRAM COMPLETE    4. Mixed hyperlipidemia  E78.2 MYOCARDIAL PERFUSION IMAGING    PCV ECHOCARDIOGRAM COMPLETE    5. Dizziness  R42 MYOCARDIAL PERFUSION IMAGING    PCV ECHOCARDIOGRAM COMPLETE    6. S/P drug eluting coronary stent placement  Z95.5 MYOCARDIAL PERFUSION IMAGING    PCV ECHOCARDIOGRAM COMPLETE   Patient is short of breath and had PCI and stenting in 2021 with no further testing since then.  Will do echo and a stress test.       Problem List Items Addressed This Visit       Other   S/P drug eluting coronary stent placement   Relevant Orders   MYOCARDIAL PERFUSION IMAGING   PCV ECHOCARDIOGRAM COMPLETE   Other Visit Diagnoses     SOB (shortness of breath)    -  Primary   stress test, echo as had PCI   Relevant Orders   MYOCARDIAL PERFUSION IMAGING   PCV ECHOCARDIOGRAM COMPLETE   Paroxysmal atrial fibrillation (HCC)       Relevant Orders   MYOCARDIAL PERFUSION IMAGING   PCV ECHOCARDIOGRAM COMPLETE   Primary hypertension       Relevant Orders   MYOCARDIAL PERFUSION IMAGING   PCV ECHOCARDIOGRAM COMPLETE   Mixed hyperlipidemia       Relevant Orders   MYOCARDIAL PERFUSION IMAGING   PCV ECHOCARDIOGRAM COMPLETE   Dizziness       Relevant Orders   MYOCARDIAL PERFUSION IMAGING   PCV ECHOCARDIOGRAM COMPLETE          Disposition:   Return in about 5 weeks (around 08/22/2023) for echo, stress test and f/u.    Total time spent: 30 minutes  Signed,  Adrian Blackwater, MD  07/18/2023 11:03 AM    [facility]

## 2023-08-16 ENCOUNTER — Other Ambulatory Visit: Payer: Self-pay | Admitting: Cardiovascular Disease

## 2023-08-22 ENCOUNTER — Ambulatory Visit (INDEPENDENT_AMBULATORY_CARE_PROVIDER_SITE_OTHER): Payer: Medicare Other

## 2023-08-22 DIAGNOSIS — Z955 Presence of coronary angioplasty implant and graft: Secondary | ICD-10-CM

## 2023-08-22 DIAGNOSIS — I10 Essential (primary) hypertension: Secondary | ICD-10-CM | POA: Diagnosis not present

## 2023-08-22 DIAGNOSIS — R0602 Shortness of breath: Secondary | ICD-10-CM

## 2023-08-22 DIAGNOSIS — E782 Mixed hyperlipidemia: Secondary | ICD-10-CM

## 2023-08-22 DIAGNOSIS — I480 Paroxysmal atrial fibrillation: Secondary | ICD-10-CM

## 2023-08-22 DIAGNOSIS — R42 Dizziness and giddiness: Secondary | ICD-10-CM

## 2023-08-22 MED ORDER — TECHNETIUM TC 99M SESTAMIBI GENERIC - CARDIOLITE
10.5000 | Freq: Once | INTRAVENOUS | Status: AC | PRN
  Administered 2023-08-22: 10.5 via INTRAVENOUS

## 2023-08-22 MED ORDER — TECHNETIUM TC 99M SESTAMIBI GENERIC - CARDIOLITE
32.8000 | Freq: Once | INTRAVENOUS | Status: AC | PRN
  Administered 2023-08-22: 32.8 via INTRAVENOUS

## 2023-08-25 ENCOUNTER — Other Ambulatory Visit: Payer: Self-pay | Admitting: Cardiology

## 2023-08-29 ENCOUNTER — Ambulatory Visit (INDEPENDENT_AMBULATORY_CARE_PROVIDER_SITE_OTHER): Payer: Medicare Other

## 2023-08-29 DIAGNOSIS — E782 Mixed hyperlipidemia: Secondary | ICD-10-CM

## 2023-08-29 DIAGNOSIS — R42 Dizziness and giddiness: Secondary | ICD-10-CM

## 2023-08-29 DIAGNOSIS — I422 Other hypertrophic cardiomyopathy: Secondary | ICD-10-CM

## 2023-08-29 DIAGNOSIS — R0602 Shortness of breath: Secondary | ICD-10-CM

## 2023-08-29 DIAGNOSIS — Z955 Presence of coronary angioplasty implant and graft: Secondary | ICD-10-CM

## 2023-08-29 DIAGNOSIS — I10 Essential (primary) hypertension: Secondary | ICD-10-CM

## 2023-08-29 DIAGNOSIS — I480 Paroxysmal atrial fibrillation: Secondary | ICD-10-CM

## 2023-09-05 ENCOUNTER — Encounter: Payer: Self-pay | Admitting: Cardiovascular Disease

## 2023-09-05 ENCOUNTER — Ambulatory Visit: Payer: Medicare Other | Admitting: Cardiovascular Disease

## 2023-09-05 VITALS — BP 118/71 | HR 65 | Ht 72.0 in | Wt 197.4 lb

## 2023-09-05 DIAGNOSIS — I480 Paroxysmal atrial fibrillation: Secondary | ICD-10-CM | POA: Diagnosis not present

## 2023-09-05 DIAGNOSIS — E782 Mixed hyperlipidemia: Secondary | ICD-10-CM

## 2023-09-05 DIAGNOSIS — I10 Essential (primary) hypertension: Secondary | ICD-10-CM

## 2023-09-05 DIAGNOSIS — R0602 Shortness of breath: Secondary | ICD-10-CM

## 2023-09-05 DIAGNOSIS — R002 Palpitations: Secondary | ICD-10-CM

## 2023-09-05 DIAGNOSIS — Z955 Presence of coronary angioplasty implant and graft: Secondary | ICD-10-CM

## 2023-09-05 NOTE — Progress Notes (Signed)
Cardiology Office Note   Date:  09/05/2023   ID:  Brent Alvarado 1946/04/04, MRN [id number]  PCP:  Danella Penton, MD  Cardiologist:  Adrian Blackwater, MD      History of Present Illness: Brent Alvarado is a 77 y.o. male who presents for No chief complaint on file.   HPI    Past Medical History:  Diagnosis Date   Anxiety    BPH (benign prostatic hyperplasia)    Depression    Diabetes mellitus without complication (HCC)    Controlled poorly with metformin   Diverticulitis    Dyspnea    Dysrhythmia    GERD (gastroesophageal reflux disease)    Hyperlipidemia    Hypertension      Past Surgical History:  Procedure Laterality Date   COLONOSCOPY WITH PROPOFOL N/A 08/28/2015   Procedure: COLONOSCOPY WITH PROPOFOL;  Surgeon: Scot Jun, MD;  Location: [facility] ENDOSCOPY;  Service: Endoscopy;  Laterality: N/A;   CORONARY STENT INTERVENTION N/A 02/11/2020   Procedure: CORONARY STENT INTERVENTION;  Surgeon: Alwyn Pea, MD;  Location: [facility] INVASIVE CV LAB;  Service: Cardiovascular;  Laterality: N/A;   ESOPHAGOGASTRODUODENOSCOPY (EGD) WITH PROPOFOL N/A 08/28/2015   Procedure: ESOPHAGOGASTRODUODENOSCOPY (EGD) WITH PROPOFOL;  Surgeon: Scot Jun, MD;  Location: [facility] ENDOSCOPY;  Service: Endoscopy;  Laterality: N/A;   LEFT HEART CATH AND CORONARY ANGIOGRAPHY Left 02/11/2020   Procedure: LEFT HEART CATH AND CORONARY ANGIOGRAPHY;  Surgeon: Laurier Nancy, MD;  Location: [facility] INVASIVE CV LAB;  Service: Cardiovascular;  Laterality: Left;   SAVORY DILATION N/A 08/28/2015   Procedure: SAVORY DILATION;  Surgeon: Scot Jun, MD;  Location: [facility] ENDOSCOPY;  Service: Endoscopy;  Laterality: N/A;     Current Outpatient Medications  Medication Sig Dispense Refill   baclofen (LIORESAL) 10 MG tablet Take 10 mg by mouth 2 (two) times daily.     chlorhexidine (PERIDEX) 0.12 % solution Use as directed 5 mLs in the mouth or throat 2 (two) times daily.     glucose blood  (PRECISION QID TEST) test strip 1 each by Other route as needed for other.     pantoprazole (PROTONIX) 40 MG tablet Take 40 mg by mouth 2 (two) times daily.     clopidogrel (PLAVIX) 75 MG tablet TAKE 1 TABLET BY MOUTH EVERY DAY. 90 tablet 0   Dulaglutide (TRULICITY) 0.75 MG/0.5ML SOPN Inject into the skin once a week.     fenofibrate 54 MG tablet TAKE ONE TABLET EVERY DAY FOR TRIGLYCERIDES 90 tablet 3   furosemide (LASIX) 20 MG tablet Take 20 mg by mouth daily.      glimepiride (AMARYL) 2 MG tablet Take 2 mg by mouth daily.     isosorbide mononitrate (IMDUR) 30 MG 24 hr tablet TAKE 1 TABLET BY MOUTH DAILY 90 tablet 0   levothyroxine (SYNTHROID) 50 MCG tablet Take 50 mcg by mouth daily.     losartan (COZAAR) 50 MG tablet Take 1 tablet (50 mg total) by mouth every morning. 90 tablet 0   nitroGLYCERIN (NITROSTAT) 0.4 MG SL tablet Place 1 tablet (0.4 mg total) under the tongue every 5 (five) minutes x 3 doses as needed for chest pain. 20 tablet 1   PARoxetine (PAXIL) 40 MG tablet Take 40 mg by mouth every morning.     polyethylene glycol (MIRALAX / GLYCOLAX) 17 g packet Take 17 g by mouth daily.     rosuvastatin (CRESTOR) 40 MG tablet Take 1 tablet (40 mg total) by mouth  daily. 90 tablet 3   sotalol (BETAPACE) 80 MG tablet TAKE ONE (1) TABLET BY MOUTH TWO TIMES PER DAY 180 tablet 3   tamsulosin (FLOMAX) 0.4 MG CAPS capsule Take 0.4 mg by mouth.     XARELTO 15 MG TABS tablet TAKE ONE TABLET EVERY DAY 90 tablet 1   No current facility-administered medications for this visit.   Facility-Administered Medications Ordered in Other Visits  Medication Dose Route Frequency Provider Last Rate Last Admin   0.9 %  sodium chloride infusion  250 mL Intravenous PRN Adrian Blackwater A, MD       sodium chloride flush (NS) 0.9 % injection 3 mL  3 mL Intravenous Q12H Adrian Blackwater A, MD       sodium chloride flush (NS) 0.9 % injection 3 mL  3 mL Intravenous PRN Adrian Blackwater A, MD       sodium chloride flush (NS) 0.9  % injection 3 mL  3 mL Intravenous Q12H Laurier Nancy, MD        Allergies:   Ace inhibitors    Social History:   reports that he has never smoked. He has never used smokeless tobacco. He reports that he does not drink alcohol and does not use drugs.   Family History:  family history is not on file.    ROS:     Review of Systems  Constitutional: Negative.   HENT: Negative.    Eyes: Negative.   Respiratory: Negative.    Gastrointestinal: Negative.   Genitourinary: Negative.   Musculoskeletal: Negative.   Skin: Negative.   Neurological: Negative.   Endo/Heme/Allergies: Negative.   Psychiatric/Behavioral: Negative.    All other systems reviewed and are negative.     All other systems are reviewed and negative.    PHYSICAL EXAM: VS:  BP 118/71   Pulse 65   Ht 6' (1.829 m)   Wt 197 lb 6.4 oz (89.5 kg)   SpO2 98%   BMI 26.77 kg/m  , BMI Body mass index is 26.77 kg/m. Last weight:  Wt Readings from Last 3 Encounters:  09/05/23 197 lb 6.4 oz (89.5 kg)  07/18/23 196 lb (88.9 kg)  06/13/23 196 lb 9.6 oz (89.2 kg)     Physical Exam Vitals reviewed.  Constitutional:      Appearance: Normal appearance. He is normal weight.  HENT:     Head: Normocephalic.     Nose: Nose normal.     Mouth/Throat:     Mouth: Mucous membranes are moist.  Eyes:     Pupils: Pupils are equal, round, and reactive to light.  Cardiovascular:     Rate and Rhythm: Normal rate and regular rhythm.     Pulses: Normal pulses.     Heart sounds: Normal heart sounds.  Pulmonary:     Effort: Pulmonary effort is normal.  Abdominal:     General: Abdomen is flat. Bowel sounds are normal.  Musculoskeletal:        General: Normal range of motion.     Cervical back: Normal range of motion.  Skin:    General: Skin is warm.  Neurological:     General: No focal deficit present.     Mental Status: He is alert.  Psychiatric:        Mood and Affect: Mood normal.       EKG:   Recent Labs: No  results found for requested labs within last 365 days.    Lipid Panel No results found for: "CHOL", "  TRIG", "HDL", "CHOLHDL", "VLDL", "LDLCALC", "LDLDIRECT"    Other studies Reviewed: Additional studies/ records that were reviewed today include:  Review of the above records demonstrates:       No data to display            ASSESSMENT AND PLAN:    ICD-10-CM   1. Paroxysmal atrial fibrillation (HCC)  I48.0    Remains in normal sinus rhythm on sotalol 80 mg once a day.  Patient has occasional palpitation and make take extra dose when that happens.    2. Primary hypertension  I10     3. Mixed hyperlipidemia  E78.2     4. S/P drug eluting coronary stent placement  Z95.5     5. SOB (shortness of breath)  R06.02     6. Palpitation  R00.2    Take extra dose of sotalol as needed for palpitation.  No further chest pain and stress test was unremarkable with no ischemia.       Problem List Items Addressed This Visit       Other   S/P drug eluting coronary stent placement   Other Visit Diagnoses     Paroxysmal atrial fibrillation (HCC)    -  Primary   Remains in normal sinus rhythm on sotalol 80 mg once a day.  Patient has occasional palpitation and make take extra dose when that happens.   Primary hypertension       Mixed hyperlipidemia       SOB (shortness of breath)       Palpitation       Take extra dose of sotalol as needed for palpitation.  No further chest pain and stress test was unremarkable with no ischemia.          Disposition:   Return in about 3 months (around 12/06/2023).    Total time spent: 35 minutes  Signed,  Adrian Blackwater, MD  09/05/2023 11:46 AM    [facility]

## 2023-11-09 ENCOUNTER — Other Ambulatory Visit: Payer: Self-pay | Admitting: Cardiovascular Disease

## 2023-11-16 ENCOUNTER — Other Ambulatory Visit: Payer: Self-pay | Admitting: Cardiovascular Disease

## 2023-11-23 ENCOUNTER — Other Ambulatory Visit: Payer: Self-pay | Admitting: Cardiology

## 2023-12-05 ENCOUNTER — Ambulatory Visit: Payer: Medicare Other | Admitting: Cardiovascular Disease

## 2023-12-05 ENCOUNTER — Encounter: Payer: Self-pay | Admitting: Cardiovascular Disease

## 2023-12-05 VITALS — BP 132/78 | HR 64 | Ht 72.0 in | Wt 198.3 lb

## 2023-12-05 DIAGNOSIS — I480 Paroxysmal atrial fibrillation: Secondary | ICD-10-CM

## 2023-12-05 DIAGNOSIS — Z955 Presence of coronary angioplasty implant and graft: Secondary | ICD-10-CM | POA: Diagnosis not present

## 2023-12-05 DIAGNOSIS — E782 Mixed hyperlipidemia: Secondary | ICD-10-CM | POA: Diagnosis not present

## 2023-12-05 DIAGNOSIS — R0602 Shortness of breath: Secondary | ICD-10-CM

## 2023-12-05 DIAGNOSIS — I10 Essential (primary) hypertension: Secondary | ICD-10-CM

## 2023-12-05 NOTE — Progress Notes (Signed)
Cardiology Office Note   Date:  12/05/2023   ID:  Haidan, Nhan August 20, 1946, MRN [id number]  PCP:  Danella Penton, MD  Cardiologist:  Adrian Blackwater, MD      History of Present Illness: Brent Alvarado is a 78 y.o. male who presents for  Chief Complaint  Patient presents with   Follow-up    3 month follow up     Doing well      Past Medical History:  Diagnosis Date   Anxiety    BPH (benign prostatic hyperplasia)    Depression    Diabetes mellitus without complication (HCC)    Controlled poorly with metformin   Diverticulitis    Dyspnea    Dysrhythmia    GERD (gastroesophageal reflux disease)    Hyperlipidemia    Hypertension      Past Surgical History:  Procedure Laterality Date   COLONOSCOPY WITH PROPOFOL N/A 08/28/2015   Procedure: COLONOSCOPY WITH PROPOFOL;  Surgeon: Scot Jun, MD;  Location: [facility] ENDOSCOPY;  Service: Endoscopy;  Laterality: N/A;   CORONARY STENT INTERVENTION N/A 02/11/2020   Procedure: CORONARY STENT INTERVENTION;  Surgeon: Alwyn Pea, MD;  Location: [facility] INVASIVE CV LAB;  Service: Cardiovascular;  Laterality: N/A;   ESOPHAGOGASTRODUODENOSCOPY (EGD) WITH PROPOFOL N/A 08/28/2015   Procedure: ESOPHAGOGASTRODUODENOSCOPY (EGD) WITH PROPOFOL;  Surgeon: Scot Jun, MD;  Location: [facility] ENDOSCOPY;  Service: Endoscopy;  Laterality: N/A;   LEFT HEART CATH AND CORONARY ANGIOGRAPHY Left 02/11/2020   Procedure: LEFT HEART CATH AND CORONARY ANGIOGRAPHY;  Surgeon: Laurier Nancy, MD;  Location: [facility] INVASIVE CV LAB;  Service: Cardiovascular;  Laterality: Left;   SAVORY DILATION N/A 08/28/2015   Procedure: SAVORY DILATION;  Surgeon: Scot Jun, MD;  Location: [facility] ENDOSCOPY;  Service: Endoscopy;  Laterality: N/A;     Current Outpatient Medications  Medication Sig Dispense Refill   baclofen (LIORESAL) 10 MG tablet Take 10 mg by mouth 2 (two) times daily.     chlorhexidine (PERIDEX) 0.12 % solution Use as directed 5 mLs in  the mouth or throat 2 (two) times daily.     clopidogrel (PLAVIX) 75 MG tablet TAKE 1 TABLET BY MOUTH EVERY DAY. 90 tablet 0   Dulaglutide (TRULICITY) 0.75 MG/0.5ML SOPN Inject into the skin once a week.     fenofibrate 54 MG tablet TAKE ONE TABLET EVERY DAY FOR TRIGLYCERIDES 90 tablet 3   furosemide (LASIX) 20 MG tablet Take 20 mg by mouth daily.      glimepiride (AMARYL) 2 MG tablet Take 2 mg by mouth daily.     glucose blood (PRECISION QID TEST) test strip 1 each by Other route as needed for other.     isosorbide mononitrate (IMDUR) 30 MG 24 hr tablet TAKE 1 TABLET BY MOUTH DAILY 90 tablet 0   levothyroxine (SYNTHROID) 50 MCG tablet Take 50 mcg by mouth daily.     losartan (COZAAR) 50 MG tablet Take 1 tablet (50 mg total) by mouth every morning. 90 tablet 0   nitroGLYCERIN (NITROSTAT) 0.4 MG SL tablet Place 1 tablet (0.4 mg total) under the tongue every 5 (five) minutes x 3 doses as needed for chest pain. 20 tablet 1   pantoprazole (PROTONIX) 40 MG tablet Take 40 mg by mouth 2 (two) times daily.     PARoxetine (PAXIL) 40 MG tablet Take 40 mg by mouth every morning.     polyethylene glycol (MIRALAX / GLYCOLAX) 17 g packet Take 17 g by mouth daily.  rosuvastatin (CRESTOR) 40 MG tablet Take 1 tablet (40 mg total) by mouth daily. 90 tablet 3   sotalol (BETAPACE) 80 MG tablet TAKE ONE (1) TABLET BY MOUTH TWO TIMES PER DAY 180 tablet 3   tamsulosin (FLOMAX) 0.4 MG CAPS capsule Take 0.4 mg by mouth.     XARELTO 15 MG TABS tablet TAKE ONE TABLET EVERY DAY 90 tablet 1   No current facility-administered medications for this visit.   Facility-Administered Medications Ordered in Other Visits  Medication Dose Route Frequency Provider Last Rate Last Admin   0.9 %  sodium chloride infusion  250 mL Intravenous PRN Adrian Blackwater A, MD       sodium chloride flush (NS) 0.9 % injection 3 mL  3 mL Intravenous Q12H Adrian Blackwater A, MD       sodium chloride flush (NS) 0.9 % injection 3 mL  3 mL Intravenous  PRN Adrian Blackwater A, MD       sodium chloride flush (NS) 0.9 % injection 3 mL  3 mL Intravenous Q12H Laurier Nancy, MD        Allergies:   Ace inhibitors    Social History:   reports that he has never smoked. He has never used smokeless tobacco. He reports that he does not drink alcohol and does not use drugs.   Family History:  family history is not on file.    ROS:     Review of Systems  Constitutional: Negative.   HENT: Negative.    Eyes: Negative.   Respiratory: Negative.    Gastrointestinal: Negative.   Genitourinary: Negative.   Musculoskeletal: Negative.   Skin: Negative.   Neurological: Negative.   Endo/Heme/Allergies: Negative.   Psychiatric/Behavioral: Negative.    All other systems reviewed and are negative.     All other systems are reviewed and negative.    PHYSICAL EXAM: VS:  BP 132/78   Pulse 64   Ht 6' (1.829 m)   Wt 198 lb 4.8 oz (89.9 kg)   SpO2 98%   BMI 26.89 kg/m  , BMI Body mass index is 26.89 kg/m. Last weight:  Wt Readings from Last 3 Encounters:  12/05/23 198 lb 4.8 oz (89.9 kg)  09/05/23 197 lb 6.4 oz (89.5 kg)  07/18/23 196 lb (88.9 kg)     Physical Exam Vitals reviewed.  Constitutional:      Appearance: Normal appearance. He is normal weight.  HENT:     Head: Normocephalic.     Nose: Nose normal.     Mouth/Throat:     Mouth: Mucous membranes are moist.  Eyes:     Pupils: Pupils are equal, round, and reactive to light.  Cardiovascular:     Rate and Rhythm: Normal rate and regular rhythm.     Pulses: Normal pulses.     Heart sounds: Normal heart sounds.  Pulmonary:     Effort: Pulmonary effort is normal.  Abdominal:     General: Abdomen is flat. Bowel sounds are normal.  Musculoskeletal:        General: Normal range of motion.     Cervical back: Normal range of motion.  Skin:    General: Skin is warm.  Neurological:     General: No focal deficit present.     Mental Status: He is alert.  Psychiatric:        Mood  and Affect: Mood normal.       EKG:   Recent Labs: No results found for requested labs within  last 365 days.    Lipid Panel No results found for: "CHOL", "TRIG", "HDL", "CHOLHDL", "VLDL", "LDLCALC", "LDLDIRECT"    Other studies Reviewed: Additional studies/ records that were reviewed today include:  Review of the above records demonstrates:       No data to display            ASSESSMENT AND PLAN:    ICD-10-CM   1. Essential hypertension  I10     2. 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 Mixed hyperlipidemia  E78.2     4. Primary hypertension  I10     5. Paroxysmal atrial fibrillation (HCC)  I48.0     6. SOB (shortness of breath)  R06.02        Problem List Items Addressed This Visit       Cardiovascular and Mediastinum   Essential hypertension - Primary     Other   S/P drug eluting coronary stent placement   Other Visit Diagnoses       Mixed hyperlipidemia         Primary hypertension         Paroxysmal atrial fibrillation (HCC)         SOB (shortness of breath)              Disposition:   Return in about 2 months (around 02/02/2024).    Total time spent: 30 minutes  Signed,  Adrian Blackwater, MD  12/05/2023 9:34 AM    [facility]

## 2024-02-02 ENCOUNTER — Encounter: Payer: Self-pay | Admitting: Cardiovascular Disease

## 2024-02-02 ENCOUNTER — Ambulatory Visit: Payer: Medicare Other | Admitting: Cardiovascular Disease

## 2024-02-02 VITALS — BP 112/60 | HR 70 | Ht 72.0 in | Wt 194.0 lb

## 2024-02-02 DIAGNOSIS — I10 Essential (primary) hypertension: Secondary | ICD-10-CM | POA: Diagnosis not present

## 2024-02-02 DIAGNOSIS — I480 Paroxysmal atrial fibrillation: Secondary | ICD-10-CM

## 2024-02-02 DIAGNOSIS — R0602 Shortness of breath: Secondary | ICD-10-CM

## 2024-02-02 DIAGNOSIS — Z955 Presence of coronary angioplasty implant and graft: Secondary | ICD-10-CM

## 2024-02-02 DIAGNOSIS — E782 Mixed hyperlipidemia: Secondary | ICD-10-CM

## 2024-02-02 NOTE — Progress Notes (Signed)
 Cardiology Office Note   Date:  02/02/2024   ID:  Colson, Barco 11-13-1945, MRN [id number]  PCP:  Danella Penton, MD  Cardiologist:  Adrian Blackwater, MD      History of Present Illness: Brent Alvarado is a 78 y.o. male who presents for  Chief Complaint  Patient presents with   Follow-up    2 Months Follow Up    Gets SOB on exertion      Past Medical History:  Diagnosis Date   Anxiety    BPH (benign prostatic hyperplasia)    Depression    Diabetes mellitus without complication (HCC)    Controlled poorly with metformin   Diverticulitis    Dyspnea    Dysrhythmia    GERD (gastroesophageal reflux disease)    Hyperlipidemia    Hypertension      Past Surgical History:  Procedure Laterality Date   COLONOSCOPY WITH PROPOFOL N/A 08/28/2015   Procedure: COLONOSCOPY WITH PROPOFOL;  Surgeon: Scot Jun, MD;  Location: [facility] ENDOSCOPY;  Service: Endoscopy;  Laterality: N/A;   CORONARY STENT INTERVENTION N/A 02/11/2020   Procedure: CORONARY STENT INTERVENTION;  Surgeon: Alwyn Pea, MD;  Location: [facility] INVASIVE CV LAB;  Service: Cardiovascular;  Laterality: N/A;   ESOPHAGOGASTRODUODENOSCOPY (EGD) WITH PROPOFOL N/A 08/28/2015   Procedure: ESOPHAGOGASTRODUODENOSCOPY (EGD) WITH PROPOFOL;  Surgeon: Scot Jun, MD;  Location: [facility] ENDOSCOPY;  Service: Endoscopy;  Laterality: N/A;   LEFT HEART CATH AND CORONARY ANGIOGRAPHY Left 02/11/2020   Procedure: LEFT HEART CATH AND CORONARY ANGIOGRAPHY;  Surgeon: Laurier Nancy, MD;  Location: [facility] INVASIVE CV LAB;  Service: Cardiovascular;  Laterality: Left;   SAVORY DILATION N/A 08/28/2015   Procedure: SAVORY DILATION;  Surgeon: Scot Jun, MD;  Location: [facility] ENDOSCOPY;  Service: Endoscopy;  Laterality: N/A;     Current Outpatient Medications  Medication Sig Dispense Refill   baclofen (LIORESAL) 10 MG tablet Take 10 mg by mouth 2 (two) times daily.     chlorhexidine (PERIDEX) 0.12 % solution Use as  directed 5 mLs in the mouth or throat 2 (two) times daily.     clopidogrel (PLAVIX) 75 MG tablet TAKE 1 TABLET BY MOUTH EVERY DAY. 90 tablet 0   Dulaglutide (TRULICITY) 0.75 MG/0.5ML SOPN Inject into the skin once a week.     fenofibrate 54 MG tablet TAKE ONE TABLET EVERY DAY FOR TRIGLYCERIDES 90 tablet 3   furosemide (LASIX) 20 MG tablet Take 20 mg by mouth daily.      glimepiride (AMARYL) 2 MG tablet Take 2 mg by mouth daily.     glucose blood (PRECISION QID TEST) test strip 1 each by Other route as needed for other.     isosorbide mononitrate (IMDUR) 30 MG 24 hr tablet TAKE 1 TABLET BY MOUTH DAILY 90 tablet 0   levothyroxine (SYNTHROID) 50 MCG tablet Take 50 mcg by mouth daily.     losartan (COZAAR) 50 MG tablet Take 1 tablet (50 mg total) by mouth every morning. 90 tablet 0   nitroGLYCERIN (NITROSTAT) 0.4 MG SL tablet Place 1 tablet (0.4 mg total) under the tongue every 5 (five) minutes x 3 doses as needed for chest pain. 20 tablet 1   pantoprazole (PROTONIX) 40 MG tablet Take 40 mg by mouth 2 (two) times daily.     PARoxetine (PAXIL) 40 MG tablet Take 40 mg by mouth every morning.     polyethylene glycol (MIRALAX / GLYCOLAX) 17 g packet Take 17 g by mouth  daily.     rosuvastatin (CRESTOR) 40 MG tablet Take 1 tablet (40 mg total) by mouth daily. 90 tablet 3   sotalol (BETAPACE) 80 MG tablet TAKE ONE (1) TABLET BY MOUTH TWO TIMES PER DAY 180 tablet 3   tamsulosin (FLOMAX) 0.4 MG CAPS capsule Take 0.4 mg by mouth.     XARELTO 15 MG TABS tablet TAKE ONE TABLET EVERY DAY 90 tablet 1   No current facility-administered medications for this visit.   Facility-Administered Medications Ordered in Other Visits  Medication Dose Route Frequency Provider Last Rate Last Admin   0.9 %  sodium chloride infusion  250 mL Intravenous PRN Debborah Fairly A, MD       sodium chloride flush (NS) 0.9 % injection 3 mL  3 mL Intravenous Q12H Debborah Fairly A, MD       sodium chloride flush (NS) 0.9 % injection 3 mL   3 mL Intravenous PRN Debborah Fairly A, MD       sodium chloride flush (NS) 0.9 % injection 3 mL  3 mL Intravenous Q12H Cherrie Cornwall, MD        Allergies:   Ace inhibitors    Social History:   reports that he has never smoked. He has never used smokeless tobacco. He reports that he does not drink alcohol and does not use drugs.   Family History:  family history is not on file.    ROS:     Review of Systems  Constitutional: Negative.   HENT: Negative.    Eyes: Negative.   Respiratory: Negative.    Gastrointestinal: Negative.   Genitourinary: Negative.   Musculoskeletal: Negative.   Skin: Negative.   Neurological: Negative.   Endo/Heme/Allergies: Negative.   Psychiatric/Behavioral: Negative.    All other systems reviewed and are negative.     All other systems are reviewed and negative.    PHYSICAL EXAM: VS:  BP 112/60   Pulse 70   Ht 6' (1.829 m)   Wt 194 lb (88 kg)   SpO2 96%   BMI 26.31 kg/m  , BMI Body mass index is 26.31 kg/m. Last weight:  Wt Readings from Last 3 Encounters:  02/02/24 194 lb (88 kg)  12/05/23 198 lb 4.8 oz (89.9 kg)  09/05/23 197 lb 6.4 oz (89.5 kg)     Physical Exam    EKG:   Recent Labs: No results found for requested labs within last 365 days.    Lipid Panel No results found for: "CHOL", "TRIG", "HDL", "CHOLHDL", "VLDL", "LDLCALC", "LDLDIRECT"    Other studies Reviewed: Additional studies/ records that were reviewed today include:  Review of the above records demonstrates:       No data to display            ASSESSMENT AND PLAN:    ICD-10-CM   1. Paroxysmal atrial fibrillation (HCC)  I48.0     2. SOB (shortness of breath)  R06.02    only on exertion, overall doing well    3. Primary hypertension  I10     4. S/P drug eluting coronary stent placement  Z95.5     5. Mixed hyperlipidemia  E78.2        Problem List Items Addressed This Visit       Other   S/P drug eluting coronary stent placement    Other Visit Diagnoses       Paroxysmal atrial fibrillation (HCC)    -  Primary     SOB (shortness  of breath)       only on exertion, overall doing well     Primary hypertension         Mixed hyperlipidemia              Disposition:   No follow-ups on file.    Total time spent: 30 minutes  Signed,  Debborah Fairly, MD  02/02/2024 11:18 AM    [facility]

## 2024-02-09 ENCOUNTER — Other Ambulatory Visit: Payer: Self-pay | Admitting: Cardiovascular Disease

## 2024-02-20 ENCOUNTER — Other Ambulatory Visit: Payer: Self-pay | Admitting: Cardiology

## 2024-05-04 ENCOUNTER — Other Ambulatory Visit: Payer: Self-pay | Admitting: Cardiovascular Disease

## 2024-05-07 ENCOUNTER — Other Ambulatory Visit: Payer: Self-pay | Admitting: Cardiovascular Disease

## 2024-05-21 ENCOUNTER — Other Ambulatory Visit: Payer: Self-pay | Admitting: Cardiovascular Disease

## 2024-08-02 ENCOUNTER — Encounter: Payer: Self-pay | Admitting: Cardiovascular Disease

## 2024-08-02 ENCOUNTER — Ambulatory Visit: Admitting: Cardiovascular Disease

## 2024-08-02 VITALS — BP 110/58 | HR 63 | Ht 72.0 in | Wt 194.6 lb

## 2024-08-02 DIAGNOSIS — I10 Essential (primary) hypertension: Secondary | ICD-10-CM

## 2024-08-02 DIAGNOSIS — Z955 Presence of coronary angioplasty implant and graft: Secondary | ICD-10-CM

## 2024-08-02 DIAGNOSIS — I480 Paroxysmal atrial fibrillation: Secondary | ICD-10-CM

## 2024-08-02 DIAGNOSIS — R0602 Shortness of breath: Secondary | ICD-10-CM

## 2024-08-02 DIAGNOSIS — E782 Mixed hyperlipidemia: Secondary | ICD-10-CM

## 2024-08-02 NOTE — Progress Notes (Signed)
 Cardiology Office Note   Date:  08/02/2024   ID:  Brent Alvarado, Brent Alvarado 1946/08/09, MRN [id number]  PCP:  Cleotilde Oneil FALCON, MD  Cardiologist:  Denyse Bathe, MD      History of Present Illness: Brent Alvarado is a 78 y.o. male who presents for  Chief Complaint  Patient presents with   Follow-up    6 month follow up    Gets SOB on exertion.      Past Medical History:  Diagnosis Date   Anxiety    BPH (benign prostatic hyperplasia)    Depression    Diabetes mellitus without complication (HCC)    Controlled poorly with metformin   Diverticulitis    Dyspnea    Dysrhythmia    GERD (gastroesophageal reflux disease)    Hyperlipidemia    Hypertension      Past Surgical History:  Procedure Laterality Date   COLONOSCOPY WITH PROPOFOL  N/A 08/28/2015   Procedure: COLONOSCOPY WITH PROPOFOL ;  Surgeon: Lamar ONEIDA Holmes, MD;  Location: [facility] ENDOSCOPY;  Service: Endoscopy;  Laterality: N/A;   CORONARY STENT INTERVENTION N/A 02/11/2020   Procedure: CORONARY STENT INTERVENTION;  Surgeon: Florencio Cara BIRCH, MD;  Location: [facility] INVASIVE CV LAB;  Service: Cardiovascular;  Laterality: N/A;   ESOPHAGOGASTRODUODENOSCOPY (EGD) WITH PROPOFOL  N/A 08/28/2015   Procedure: ESOPHAGOGASTRODUODENOSCOPY (EGD) WITH PROPOFOL ;  Surgeon: Lamar ONEIDA Holmes, MD;  Location: [facility] ENDOSCOPY;  Service: Endoscopy;  Laterality: N/A;   LEFT HEART CATH AND CORONARY ANGIOGRAPHY Left 02/11/2020   Procedure: LEFT HEART CATH AND CORONARY ANGIOGRAPHY;  Surgeon: Bathe Denyse LABOR, MD;  Location: [facility] INVASIVE CV LAB;  Service: Cardiovascular;  Laterality: Left;   SAVORY DILATION N/A 08/28/2015   Procedure: SAVORY DILATION;  Surgeon: Lamar ONEIDA Holmes, MD;  Location: [facility] ENDOSCOPY;  Service: Endoscopy;  Laterality: N/A;     Current Outpatient Medications  Medication Sig Dispense Refill   baclofen  (LIORESAL ) 10 MG tablet Take 10 mg by mouth 2 (two) times daily.     chlorhexidine (PERIDEX) 0.12 % solution Use as  directed 5 mLs in the mouth or throat 2 (two) times daily.     clopidogrel  (PLAVIX ) 75 MG tablet TAKE 1 TABLET BY MOUTH ONCE DAILY 90 tablet 0   Dulaglutide (TRULICITY) 0.75 MG/0.5ML SOPN Inject into the skin once a week.     fenofibrate  54 MG tablet TAKE ONE TABLET EVERY DAY FOR TRIGLYCERIDES 90 tablet 3   furosemide (LASIX) 20 MG tablet Take 20 mg by mouth daily.      glimepiride (AMARYL) 2 MG tablet Take 2 mg by mouth daily.     glucose blood (PRECISION QID TEST) test strip 1 each by Other route as needed for other.     isosorbide mononitrate (IMDUR) 30 MG 24 hr tablet TAKE 1 TABLET BY MOUTH DAILY 90 tablet 0   levothyroxine (SYNTHROID) 50 MCG tablet Take 50 mcg by mouth daily.     losartan  (COZAAR ) 50 MG tablet Take 1 tablet (50 mg total) by mouth every morning. 90 tablet 0   nitroGLYCERIN  (NITROSTAT ) 0.4 MG SL tablet Place 1 tablet (0.4 mg total) under the tongue every 5 (five) minutes x 3 doses as needed for chest pain. 20 tablet 1   pantoprazole (PROTONIX) 40 MG tablet Take 40 mg by mouth 2 (two) times daily.     PARoxetine  (PAXIL ) 40 MG tablet Take 40 mg by mouth every morning.     polyethylene glycol (MIRALAX  / GLYCOLAX ) 17 g packet Take 17 g by mouth  daily.     rosuvastatin  (CRESTOR ) 40 MG tablet Take 1 tablet (40 mg total) by mouth daily. 90 tablet 3   sotalol  (BETAPACE ) 80 MG tablet TAKE ONE (1) TABLET BY MOUTH TWO TIMES PER DAY 180 tablet 3   tamsulosin  (FLOMAX ) 0.4 MG CAPS capsule Take 0.4 mg by mouth.     XARELTO 15 MG TABS tablet TAKE ONE TABLET EVERY DAY 90 tablet 1   No current facility-administered medications for this visit.   Facility-Administered Medications Ordered in Other Visits  Medication Dose Route Frequency Provider Last Rate Last Admin   0.9 %  sodium chloride  infusion  250 mL Intravenous PRN Fernand Alter A, MD       sodium chloride  flush (NS) 0.9 % injection 3 mL  3 mL Intravenous Q12H Fernand Alter A, MD       sodium chloride  flush (NS) 0.9 % injection 3 mL   3 mL Intravenous PRN Fernand Alter A, MD       sodium chloride  flush (NS) 0.9 % injection 3 mL  3 mL Intravenous Q12H Fernand Alter LABOR, MD        Allergies:   Ace inhibitors    Social History:   reports that he has never smoked. He has never used smokeless tobacco. He reports that he does not drink alcohol and does not use drugs.   Family History:  family history is not on file.    ROS:     Review of Systems  Constitutional: Negative.   HENT: Negative.    Eyes: Negative.   Respiratory: Negative.    Gastrointestinal: Negative.   Genitourinary: Negative.   Musculoskeletal: Negative.   Skin: Negative.   Neurological: Negative.   Endo/Heme/Allergies: Negative.   Psychiatric/Behavioral: Negative.    All other systems reviewed and are negative.     All other systems are reviewed and negative.    PHYSICAL EXAM: VS:  BP (!) 110/58   Pulse 63   Ht 6' (1.829 m)   Wt 194 lb 9.6 oz (88.3 kg)   SpO2 97%   BMI 26.39 kg/m  , BMI Body mass index is 26.39 kg/m. Last weight:  Wt Readings from Last 3 Encounters:  08/02/24 194 lb 9.6 oz (88.3 kg)  02/02/24 194 lb (88 kg)  12/05/23 198 lb 4.8 oz (89.9 kg)     Physical Exam Vitals reviewed.  Constitutional:      Appearance: Normal appearance. He is normal weight.  HENT:     Head: Normocephalic.     Nose: Nose normal.     Mouth/Throat:     Mouth: Mucous membranes are moist.  Eyes:     Pupils: Pupils are equal, round, and reactive to light.  Cardiovascular:     Rate and Rhythm: Normal rate and regular rhythm.     Pulses: Normal pulses.     Heart sounds: Normal heart sounds.  Pulmonary:     Effort: Pulmonary effort is normal.  Abdominal:     General: Abdomen is flat. Bowel sounds are normal.  Musculoskeletal:        General: Normal range of motion.     Cervical back: Normal range of motion.  Skin:    General: Skin is warm.  Neurological:     General: No focal deficit present.     Mental Status: He is alert.   Psychiatric:        Mood and Affect: Mood normal.       EKG:   Recent Labs: No  results found for requested labs within last 365 days.    Lipid Panel No results found for: CHOL, TRIG, HDL, CHOLHDL, VLDL, LDLCALC, LDLDIRECT    Other studies Reviewed: Additional studies/ records that were reviewed today include:  Review of the above records demonstrates:       No data to display            ASSESSMENT AND PLAN:    ICD-10-CM   1. Paroxysmal atrial fibrillation (HCC)  I48.0 PCV ECHOCARDIOGRAM COMPLETE    MYOCARDIAL PERFUSION IMAGING    2. SOB (shortness of breath)  R06.02 PCV ECHOCARDIOGRAM COMPLETE    MYOCARDIAL PERFUSION IMAGING   Advise echo and stress test    3. Primary hypertension  I10 PCV ECHOCARDIOGRAM COMPLETE    MYOCARDIAL PERFUSION IMAGING    4. S/P drug eluting coronary stent placement  Z95.5 PCV ECHOCARDIOGRAM COMPLETE    MYOCARDIAL PERFUSION IMAGING    5. Mixed hyperlipidemia  E78.2 PCV ECHOCARDIOGRAM COMPLETE    MYOCARDIAL PERFUSION IMAGING       Problem List Items Addressed This Visit       Other   S/P drug eluting coronary stent placement   Relevant Orders   PCV ECHOCARDIOGRAM COMPLETE   MYOCARDIAL PERFUSION IMAGING   Other Visit Diagnoses       Paroxysmal atrial fibrillation (HCC)    -  Primary   Relevant Orders   PCV ECHOCARDIOGRAM COMPLETE   MYOCARDIAL PERFUSION IMAGING     SOB (shortness of breath)       Advise echo and stress test   Relevant Orders   PCV ECHOCARDIOGRAM COMPLETE   MYOCARDIAL PERFUSION IMAGING     Primary hypertension       Relevant Orders   PCV ECHOCARDIOGRAM COMPLETE   MYOCARDIAL PERFUSION IMAGING     Mixed hyperlipidemia       Relevant Orders   PCV ECHOCARDIOGRAM COMPLETE   MYOCARDIAL PERFUSION IMAGING          Disposition:   Return in about 4 weeks (around 08/30/2024) for echo, stress test and f/u.    Total time spent: 35 minutes  Signed,  Denyse Bathe, MD  08/02/2024 2:11 PM     [facility]

## 2024-08-09 ENCOUNTER — Other Ambulatory Visit: Payer: Self-pay | Admitting: Cardiovascular Disease

## 2024-08-17 ENCOUNTER — Other Ambulatory Visit: Payer: Self-pay | Admitting: Cardiology

## 2024-08-21 ENCOUNTER — Ambulatory Visit

## 2024-08-21 DIAGNOSIS — Z955 Presence of coronary angioplasty implant and graft: Secondary | ICD-10-CM | POA: Diagnosis not present

## 2024-08-21 DIAGNOSIS — I361 Nonrheumatic tricuspid (valve) insufficiency: Secondary | ICD-10-CM

## 2024-08-21 DIAGNOSIS — R0602 Shortness of breath: Secondary | ICD-10-CM

## 2024-08-21 DIAGNOSIS — E782 Mixed hyperlipidemia: Secondary | ICD-10-CM

## 2024-08-21 DIAGNOSIS — I480 Paroxysmal atrial fibrillation: Secondary | ICD-10-CM

## 2024-08-21 DIAGNOSIS — I10 Essential (primary) hypertension: Secondary | ICD-10-CM

## 2024-08-30 ENCOUNTER — Ambulatory Visit

## 2024-08-30 DIAGNOSIS — R0602 Shortness of breath: Secondary | ICD-10-CM

## 2024-08-30 DIAGNOSIS — Z955 Presence of coronary angioplasty implant and graft: Secondary | ICD-10-CM | POA: Diagnosis not present

## 2024-08-30 DIAGNOSIS — I480 Paroxysmal atrial fibrillation: Secondary | ICD-10-CM | POA: Diagnosis not present

## 2024-08-30 DIAGNOSIS — E782 Mixed hyperlipidemia: Secondary | ICD-10-CM

## 2024-08-30 DIAGNOSIS — I10 Essential (primary) hypertension: Secondary | ICD-10-CM

## 2024-09-04 ENCOUNTER — Ambulatory Visit: Admitting: Cardiovascular Disease

## 2024-09-04 ENCOUNTER — Encounter: Payer: Self-pay | Admitting: Cardiovascular Disease

## 2024-09-04 VITALS — BP 112/52 | HR 67 | Ht 72.0 in | Wt 196.6 lb

## 2024-09-04 DIAGNOSIS — I5033 Acute on chronic diastolic (congestive) heart failure: Secondary | ICD-10-CM | POA: Diagnosis not present

## 2024-09-04 DIAGNOSIS — R0602 Shortness of breath: Secondary | ICD-10-CM | POA: Diagnosis not present

## 2024-09-04 DIAGNOSIS — I10 Essential (primary) hypertension: Secondary | ICD-10-CM | POA: Diagnosis not present

## 2024-09-04 DIAGNOSIS — Z955 Presence of coronary angioplasty implant and graft: Secondary | ICD-10-CM

## 2024-09-04 MED ORDER — DAPAGLIFLOZIN PROPANEDIOL 10 MG PO TABS: 10.0000 mg | ORAL_TABLET | Freq: Every day | ORAL | 3 refills | Status: DC

## 2024-09-04 NOTE — Progress Notes (Signed)
 Cardiology Office Note   Date:  09/04/2024   ID:  Karen, Kinnard 01/26/1946, MRN [id number]  PCP:  Cleotilde Oneil FALCON, MD  Cardiologist:  Denyse Bathe, MD      History of Present Illness: Brent Alvarado is a 78 y.o. male who presents for  Chief Complaint  Patient presents with   Follow-up    Echo and stress results    STILL HAVING DIZZY SPELLS WHEN HE STANDS UP.      Past Medical History:  Diagnosis Date   Anxiety    BPH (benign prostatic hyperplasia)    Depression    Diabetes mellitus without complication (HCC)    Controlled poorly with metformin   Diverticulitis    Dyspnea    Dysrhythmia    GERD (gastroesophageal reflux disease)    Hyperlipidemia    Hypertension      Past Surgical History:  Procedure Laterality Date   COLONOSCOPY WITH PROPOFOL  N/A 08/28/2015   Procedure: COLONOSCOPY WITH PROPOFOL ;  Surgeon: Lamar ONEIDA Holmes, MD;  Location: [facility] ENDOSCOPY;  Service: Endoscopy;  Laterality: N/A;   CORONARY STENT INTERVENTION N/A 02/11/2020   Procedure: CORONARY STENT INTERVENTION;  Surgeon: Florencio Cara BIRCH, MD;  Location: [facility] INVASIVE CV LAB;  Service: Cardiovascular;  Laterality: N/A;   ESOPHAGOGASTRODUODENOSCOPY (EGD) WITH PROPOFOL  N/A 08/28/2015   Procedure: ESOPHAGOGASTRODUODENOSCOPY (EGD) WITH PROPOFOL ;  Surgeon: Lamar ONEIDA Holmes, MD;  Location: [facility] ENDOSCOPY;  Service: Endoscopy;  Laterality: N/A;   LEFT HEART CATH AND CORONARY ANGIOGRAPHY Left 02/11/2020   Procedure: LEFT HEART CATH AND CORONARY ANGIOGRAPHY;  Surgeon: Bathe Denyse LABOR, MD;  Location: [facility] INVASIVE CV LAB;  Service: Cardiovascular;  Laterality: Left;   SAVORY DILATION N/A 08/28/2015   Procedure: SAVORY DILATION;  Surgeon: Lamar ONEIDA Holmes, MD;  Location: [facility] ENDOSCOPY;  Service: Endoscopy;  Laterality: N/A;     Current Outpatient Medications  Medication Sig Dispense Refill   baclofen  (LIORESAL ) 10 MG tablet Take 10 mg by mouth 2 (two) times daily.     chlorhexidine (PERIDEX)  0.12 % solution Use as directed 5 mLs in the mouth or throat 2 (two) times daily.     clopidogrel  (PLAVIX ) 75 MG tablet TAKE 1 TABLET BY MOUTH ONCE DAILY 90 tablet 0   dapagliflozin propanediol (FARXIGA) 10 MG TABS tablet Take 1 tablet (10 mg total) by mouth daily. 30 tablet 3   Dulaglutide (TRULICITY) 0.75 MG/0.5ML SOPN Inject into the skin once a week.     fenofibrate  54 MG tablet TAKE ONE TABLET EVERY DAY FOR TRIGLYCERIDES 90 tablet 3   furosemide (LASIX) 20 MG tablet Take 20 mg by mouth daily.      glimepiride (AMARYL) 2 MG tablet Take 2 mg by mouth daily.     glucose blood (PRECISION QID TEST) test strip 1 each by Other route as needed for other.     isosorbide mononitrate (IMDUR) 30 MG 24 hr tablet TAKE 1 TABLET BY MOUTH DAILY 90 tablet 0   levothyroxine (SYNTHROID) 50 MCG tablet Take 50 mcg by mouth daily.     losartan  (COZAAR ) 50 MG tablet Take 1 tablet (50 mg total) by mouth every morning. 90 tablet 0   nitroGLYCERIN  (NITROSTAT ) 0.4 MG SL tablet Place 1 tablet (0.4 mg total) under the tongue every 5 (five) minutes x 3 doses as needed for chest pain. 20 tablet 1   pantoprazole (PROTONIX) 40 MG tablet Take 40 mg by mouth 2 (two) times daily.     PARoxetine  (PAXIL ) 40 MG  tablet Take 40 mg by mouth every morning.     polyethylene glycol (MIRALAX  / GLYCOLAX ) 17 g packet Take 17 g by mouth daily.     rosuvastatin  (CRESTOR ) 40 MG tablet Take 1 tablet (40 mg total) by mouth daily. 90 tablet 3   sotalol  (BETAPACE ) 80 MG tablet TAKE ONE (1) TABLET BY MOUTH TWO TIMES PER DAY 180 tablet 3   tamsulosin  (FLOMAX ) 0.4 MG CAPS capsule Take 0.4 mg by mouth.     XARELTO 15 MG TABS tablet TAKE ONE TABLET EVERY DAY 90 tablet 1   No current facility-administered medications for this visit.   Facility-Administered Medications Ordered in Other Visits  Medication Dose Route Frequency Provider Last Rate Last Admin   0.9 %  sodium chloride  infusion  250 mL Intravenous PRN Fernand Alter A, MD       sodium  chloride flush (NS) 0.9 % injection 3 mL  3 mL Intravenous Q12H Fernand Alter A, MD       sodium chloride  flush (NS) 0.9 % injection 3 mL  3 mL Intravenous PRN Fernand Alter A, MD       sodium chloride  flush (NS) 0.9 % injection 3 mL  3 mL Intravenous Q12H Fernand Alter LABOR, MD        Allergies:   Ace inhibitors    Social History:   reports that he has never smoked. He has never used smokeless tobacco. He reports that he does not drink alcohol and does not use drugs.   Family History:  family history is not on file.    ROS:     Review of Systems  Constitutional: Negative.   HENT: Negative.    Eyes: Negative.   Respiratory: Negative.    Gastrointestinal: Negative.   Genitourinary: Negative.   Musculoskeletal: Negative.   Skin: Negative.   Neurological: Negative.   Endo/Heme/Allergies: Negative.   Psychiatric/Behavioral: Negative.    All other systems reviewed and are negative.     All other systems are reviewed and negative.    PHYSICAL EXAM: VS:  BP (!) 112/52   Pulse 67   Ht 6' (1.829 m)   Wt 196 lb 9.6 oz (89.2 kg)   SpO2 95%   BMI 26.66 kg/m  , BMI Body mass index is 26.66 kg/m. Last weight:  Wt Readings from Last 3 Encounters:  09/04/24 196 lb 9.6 oz (89.2 kg)  08/02/24 194 lb 9.6 oz (88.3 kg)  02/02/24 194 lb (88 kg)     Physical Exam Vitals reviewed.  Constitutional:      Appearance: Normal appearance. He is normal weight.  HENT:     Head: Normocephalic.     Nose: Nose normal.     Mouth/Throat:     Mouth: Mucous membranes are moist.  Eyes:     Pupils: Pupils are equal, round, and reactive to light.  Cardiovascular:     Rate and Rhythm: Normal rate and regular rhythm.     Pulses: Normal pulses.     Heart sounds: Normal heart sounds.  Pulmonary:     Effort: Pulmonary effort is normal.  Abdominal:     General: Abdomen is flat. Bowel sounds are normal.  Musculoskeletal:        General: Normal range of motion.     Cervical back: Normal range of  motion.  Skin:    General: Skin is warm.  Neurological:     General: No focal deficit present.     Mental Status: He is alert.  Psychiatric:        Mood and Affect: Mood normal.       EKG:   Recent Labs: No results found for requested labs within last 365 days.    Lipid Panel No results found for: CHOL, TRIG, HDL, CHOLHDL, VLDL, LDLCALC, LDLDIRECT    Other studies Reviewed: Additional studies/ records that were reviewed today include:  Review of the above records demonstrates:       No data to display            ASSESSMENT AND PLAN:    ICD-10-CM   1. SOB (shortness of breath)  R06.02 dapagliflozin propanediol (FARXIGA) 10 MG TABS tablet   STRESS TEST WAS NORMAL, ECHO HAD NORMAL LVEF, DIASTOLIC DYSFUNCTION. ADD FARXIGA    2. Essential hypertension  I10 dapagliflozin propanediol (FARXIGA) 10 MG TABS tablet    3. S/P drug eluting coronary stent placement  Z95.5 dapagliflozin propanediol (FARXIGA) 10 MG TABS tablet    4. CHF (congestive heart failure), NYHA class I, acute on chronic, diastolic (HCC)  I50.33 dapagliflozin propanediol (FARXIGA) 10 MG TABS tablet       Problem List Items Addressed This Visit       Cardiovascular and Mediastinum   Essential hypertension   Relevant Medications   dapagliflozin propanediol (FARXIGA) 10 MG TABS tablet     Other   S/P drug eluting coronary stent placement   Relevant Medications   dapagliflozin propanediol (FARXIGA) 10 MG TABS tablet   Other Visit Diagnoses       SOB (shortness of breath)    -  Primary   STRESS TEST WAS NORMAL, ECHO HAD NORMAL LVEF, DIASTOLIC DYSFUNCTION. ADD FARXIGA   Relevant Medications   dapagliflozin propanediol (FARXIGA) 10 MG TABS tablet     CHF (congestive heart failure), NYHA class I, acute on chronic, diastolic (HCC)       Relevant Medications   dapagliflozin propanediol (FARXIGA) 10 MG TABS tablet          Disposition:   No follow-ups on file.    Total time  spent: 35 minutes  Signed,  Denyse Bathe, MD  09/04/2024 2:40 PM    [facility]

## 2024-10-02 ENCOUNTER — Ambulatory Visit: Admitting: Cardiovascular Disease

## 2024-10-02 ENCOUNTER — Encounter: Payer: Self-pay | Admitting: Cardiovascular Disease

## 2024-10-02 VITALS — BP 114/61 | HR 96 | Ht 72.0 in | Wt 198.2 lb

## 2024-10-02 DIAGNOSIS — E782 Mixed hyperlipidemia: Secondary | ICD-10-CM

## 2024-10-02 DIAGNOSIS — I480 Paroxysmal atrial fibrillation: Secondary | ICD-10-CM

## 2024-10-02 DIAGNOSIS — R42 Dizziness and giddiness: Secondary | ICD-10-CM

## 2024-10-02 DIAGNOSIS — I5033 Acute on chronic diastolic (congestive) heart failure: Secondary | ICD-10-CM

## 2024-10-02 DIAGNOSIS — Z955 Presence of coronary angioplasty implant and graft: Secondary | ICD-10-CM

## 2024-10-02 DIAGNOSIS — R002 Palpitations: Secondary | ICD-10-CM

## 2024-10-02 DIAGNOSIS — R0602 Shortness of breath: Secondary | ICD-10-CM

## 2024-10-02 DIAGNOSIS — I10 Essential (primary) hypertension: Secondary | ICD-10-CM

## 2024-10-02 DIAGNOSIS — I249 Acute ischemic heart disease, unspecified: Secondary | ICD-10-CM

## 2024-10-02 MED ORDER — EMPAGLIFLOZIN 25 MG PO TABS: 25.0000 mg | ORAL_TABLET | Freq: Every day | ORAL | 2 refills | Status: AC

## 2024-10-02 NOTE — Progress Notes (Signed)
 Cardiology Office Note   Date:  10/02/2024   ID:  Mj, [name] 12-13-1945, MRN [id number]  PCP:  Brent Oneil FALCON, MD  Cardiologist:  Brent Bathe, MD      History of Present Illness: Brent Alvarado is Alvarado 78 y.o. male who presents for  Chief Complaint  Patient presents with   Follow-up    4 week follow up    Cannot take farxiga  as hurts his chest.      Past Medical History:  Diagnosis Date   Anxiety    BPH (benign prostatic hyperplasia)    Depression    Diabetes mellitus without complication (HCC)    Controlled poorly with metformin   Diverticulitis    Dyspnea    Dysrhythmia    GERD (gastroesophageal reflux disease)    Hyperlipidemia    Hypertension      Past Surgical History:  Procedure Laterality Date   COLONOSCOPY WITH PROPOFOL  N/Alvarado 08/28/2015   Procedure: COLONOSCOPY WITH PROPOFOL ;  Surgeon: Brent Brent Holmes, MD;  Location: [facility] ENDOSCOPY;  Service: Endoscopy;  Laterality: N/Alvarado;   CORONARY STENT INTERVENTION N/Alvarado 02/11/2020   Procedure: CORONARY STENT INTERVENTION;  Surgeon: Brent Cara BIRCH, MD;  Location: [facility] INVASIVE CV LAB;  Service: Cardiovascular;  Laterality: N/Alvarado;   ESOPHAGOGASTRODUODENOSCOPY (EGD) WITH PROPOFOL  N/Alvarado 08/28/2015   Procedure: ESOPHAGOGASTRODUODENOSCOPY (EGD) WITH PROPOFOL ;  Surgeon: Brent Brent Holmes, MD;  Location: [facility] ENDOSCOPY;  Service: Endoscopy;  Laterality: N/Alvarado;   LEFT HEART CATH AND CORONARY ANGIOGRAPHY Left 02/11/2020   Procedure: LEFT HEART CATH AND CORONARY ANGIOGRAPHY;  Surgeon: Alvarado Brent LABOR, MD;  Location: [facility] INVASIVE CV LAB;  Service: Cardiovascular;  Laterality: Left;   SAVORY DILATION N/Alvarado 08/28/2015   Procedure: SAVORY DILATION;  Surgeon: Brent Brent Holmes, MD;  Location: [facility] ENDOSCOPY;  Service: Endoscopy;  Laterality: N/Alvarado;     Current Outpatient Medications  Medication Sig Dispense Refill   baclofen  (LIORESAL ) 10 MG tablet Take 10 mg by mouth 2 (two) times daily.     chlorhexidine (PERIDEX) 0.12 %  solution Use as directed 5 mLs in the mouth or throat 2 (two) times daily.     clopidogrel  (PLAVIX ) 75 MG tablet TAKE 1 TABLET BY MOUTH ONCE DAILY 90 tablet 0   Dulaglutide (TRULICITY) 0.75 MG/0.5ML SOPN Inject into the skin once Alvarado week.     empagliflozin  (JARDIANCE ) 25 MG TABS tablet Take 1 tablet (25 mg total) by mouth daily before breakfast. 30 tablet 2   fenofibrate  54 MG tablet TAKE ONE TABLET EVERY DAY FOR TRIGLYCERIDES 90 tablet 3   furosemide (LASIX) 20 MG tablet Take 20 mg by mouth daily.      glimepiride (AMARYL) 2 MG tablet Take 2 mg by mouth daily.     glucose blood (PRECISION QID TEST) test strip 1 each by Other route as needed for other.     isosorbide mononitrate (IMDUR) 30 MG 24 hr tablet TAKE 1 TABLET BY MOUTH DAILY 90 tablet 0   levothyroxine (SYNTHROID) 50 MCG tablet Take 50 mcg by mouth daily.     losartan  (COZAAR ) 50 MG tablet Take 1 tablet (50 mg total) by mouth every morning. 90 tablet 0   nitroGLYCERIN  (NITROSTAT ) 0.4 MG SL tablet Place 1 tablet (0.4 mg total) under the tongue every 5 (five) minutes x 3 doses as needed for chest pain. 20 tablet 1   pantoprazole (PROTONIX) 40 MG tablet Take 40 mg by mouth 2 (two) times daily.     PARoxetine  (PAXIL ) 40 MG  tablet Take 40 mg by mouth every morning.     polyethylene glycol (MIRALAX  / GLYCOLAX ) 17 g packet Take 17 g by mouth daily.     rosuvastatin  (CRESTOR ) 40 MG tablet Take 1 tablet (40 mg total) by mouth daily. 90 tablet 3   sotalol  (BETAPACE ) 80 MG tablet TAKE ONE (1) TABLET BY MOUTH TWO TIMES PER DAY 180 tablet 3   tamsulosin  (FLOMAX ) 0.4 MG CAPS capsule Take 0.4 mg by mouth.     XARELTO 15 MG TABS tablet TAKE ONE TABLET EVERY DAY 90 tablet 1   No current facility-administered medications for this visit.   Facility-Administered Medications Ordered in Other Visits  Medication Dose Route Frequency Provider Last Rate Last Admin   0.9 %  sodium chloride  infusion  250 mL Intravenous PRN Brent Alter A, MD       sodium  chloride flush (NS) 0.9 % injection 3 mL  3 mL Intravenous Q12H Brent Alter A, MD       sodium chloride  flush (NS) 0.9 % injection 3 mL  3 mL Intravenous PRN Brent Alter A, MD       sodium chloride  flush (NS) 0.9 % injection 3 mL  3 mL Intravenous Q12H Brent Alter LABOR, MD        Allergies:   Ace inhibitors    Social History:   reports that he has never smoked. He has never used smokeless tobacco. He reports that he does not drink alcohol and does not use drugs.   Family History:  family history is not on file.    ROS:     Review of Systems  Constitutional: Negative.   HENT: Negative.    Eyes: Negative.   Respiratory: Negative.    Gastrointestinal: Negative.   Genitourinary: Negative.   Musculoskeletal: Negative.   Skin: Negative.   Neurological: Negative.   Endo/Heme/Allergies: Negative.   Psychiatric/Behavioral: Negative.    All other systems reviewed and are negative.     All other systems are reviewed and negative.    PHYSICAL EXAM: VS:  BP 114/61   Pulse 96   Ht 6' (1.829 m)   Wt 198 lb 3.2 oz (89.9 kg)   SpO2 93%   BMI 26.88 kg/m  , BMI Body mass index is 26.88 kg/m. Last weight:  Wt Readings from Last 3 Encounters:  10/02/24 198 lb 3.2 oz (89.9 kg)  09/04/24 196 lb 9.6 oz (89.2 kg)  08/02/24 194 lb 9.6 oz (88.3 kg)     Physical Exam Vitals reviewed.  Constitutional:      Appearance: Normal appearance. He is normal weight.  HENT:     Head: Normocephalic.     Nose: Nose normal.     Mouth/Throat:     Mouth: Mucous membranes are moist.  Eyes:     Pupils: Pupils are equal, round, and reactive to light.  Cardiovascular:     Rate and Rhythm: Normal rate and regular rhythm.     Pulses: Normal pulses.     Heart sounds: Normal heart sounds.  Pulmonary:     Effort: Pulmonary effort is normal.  Abdominal:     General: Abdomen is flat. Bowel sounds are normal.  Musculoskeletal:        General: Normal range of motion.     Cervical back: Normal range  of motion.  Skin:    General: Skin is warm.  Neurological:     General: No focal deficit present.     Mental Status: He is alert.  Psychiatric:        Mood and Affect: Mood normal.       EKG:   Recent Labs: No results found for requested labs within last 365 days.    Lipid Panel No results found for: CHOL, TRIG, HDL, CHOLHDL, VLDL, LDLCALC, LDLDIRECT    Other studies Reviewed: Additional studies/ records that were reviewed today include:  Review of the above records demonstrates:       No data to display            ASSESSMENT AND PLAN:    ICD-10-CM   1. SOB (shortness of breath)  R06.02 empagliflozin  (JARDIANCE ) 25 MG TABS tablet    2. CHF (congestive heart failure), NYHA class I, acute on chronic, diastolic (HCC)  I50.33 empagliflozin  (JARDIANCE ) 25 MG TABS tablet   Change jaurdiance 25 mg as farxiga  caused chest pain.    3. S/P drug eluting coronary stent placement  Z95.5 empagliflozin  (JARDIANCE ) 25 MG TABS tablet    4. Essential hypertension  I10 empagliflozin  (JARDIANCE ) 25 MG TABS tablet    5. Paroxysmal atrial fibrillation (HCC)  I48.0 empagliflozin  (JARDIANCE ) 25 MG TABS tablet    6. Primary hypertension  I10 empagliflozin  (JARDIANCE ) 25 MG TABS tablet    7. Mixed hyperlipidemia  E78.2 empagliflozin  (JARDIANCE ) 25 MG TABS tablet    8. ACS (acute coronary syndrome) (HCC)  I24.9 empagliflozin  (JARDIANCE ) 25 MG TABS tablet    9. Palpitation  R00.2 empagliflozin  (JARDIANCE ) 25 MG TABS tablet    10. Dizziness  R42 empagliflozin  (JARDIANCE ) 25 MG TABS tablet       Problem List Items Addressed This Visit       Cardiovascular and Mediastinum   ACS (acute coronary syndrome) (HCC)   Relevant Medications   empagliflozin  (JARDIANCE ) 25 MG TABS tablet   Essential hypertension   Relevant Medications   empagliflozin  (JARDIANCE ) 25 MG TABS tablet     Other   S/P drug eluting coronary stent placement   Relevant Medications    empagliflozin  (JARDIANCE ) 25 MG TABS tablet   Other Visit Diagnoses       SOB (shortness of breath)    -  Primary   Relevant Medications   empagliflozin  (JARDIANCE ) 25 MG TABS tablet     CHF (congestive heart failure), NYHA class I, acute on chronic, diastolic (HCC)       Change jaurdiance 25 mg as farxiga  caused chest pain.   Relevant Medications   empagliflozin  (JARDIANCE ) 25 MG TABS tablet     Paroxysmal atrial fibrillation (HCC)       Relevant Medications   empagliflozin  (JARDIANCE ) 25 MG TABS tablet     Primary hypertension       Relevant Medications   empagliflozin  (JARDIANCE ) 25 MG TABS tablet     Mixed hyperlipidemia       Relevant Medications   empagliflozin  (JARDIANCE ) 25 MG TABS tablet     Palpitation       Relevant Medications   empagliflozin  (JARDIANCE ) 25 MG TABS tablet     Dizziness       Relevant Medications   empagliflozin  (JARDIANCE ) 25 MG TABS tablet          Disposition:   Return in about 6 weeks (around 11/13/2024).    Total time spent: 30 minutes  Signed,  Brent Bathe, MD  10/02/2024 1:43 PM    [facility]

## 2024-11-05 ENCOUNTER — Other Ambulatory Visit: Payer: Self-pay | Admitting: Cardiovascular Disease

## 2024-11-13 ENCOUNTER — Ambulatory Visit: Admitting: Cardiovascular Disease

## 2024-11-19 ENCOUNTER — Ambulatory Visit: Admitting: Cardiovascular Disease

## 2024-11-19 ENCOUNTER — Other Ambulatory Visit: Payer: Self-pay | Admitting: Cardiovascular Disease

## 2024-11-26 ENCOUNTER — Ambulatory Visit: Admitting: Cardiovascular Disease
